# Patient Record
Sex: Male | Born: 1935 | Race: White | Hispanic: No | Smoking: Former smoker
Health system: Southern US, Community
[De-identification: ages and names within clinical notes are randomized; demographics above are authoritative.]

## PROBLEM LIST (undated history)

## (undated) DIAGNOSIS — I10 Essential (primary) hypertension: Secondary | ICD-10-CM

## (undated) DIAGNOSIS — E039 Hypothyroidism, unspecified: Secondary | ICD-10-CM

## (undated) DIAGNOSIS — Z7901 Long term (current) use of anticoagulants: Secondary | ICD-10-CM

## (undated) DIAGNOSIS — I4819 Other persistent atrial fibrillation: Secondary | ICD-10-CM

## (undated) DIAGNOSIS — N184 Chronic kidney disease, stage 4 (severe): Secondary | ICD-10-CM

## (undated) DIAGNOSIS — I25810 Atherosclerosis of coronary artery bypass graft(s) without angina pectoris: Secondary | ICD-10-CM

## (undated) DIAGNOSIS — E785 Hyperlipidemia, unspecified: Secondary | ICD-10-CM

## (undated) HISTORY — DX: Long term (current) use of anticoagulants: Z79.01

## (undated) HISTORY — DX: Hyperlipidemia, unspecified: E78.5

## (undated) HISTORY — DX: Atherosclerosis of coronary artery bypass graft(s) without angina pectoris: I25.810

## (undated) HISTORY — DX: Essential (primary) hypertension: I10

## (undated) HISTORY — PX: JOINT REPLACEMENT: SHX530

## (undated) HISTORY — DX: Other persistent atrial fibrillation: I48.19

## (undated) HISTORY — PX: TOTAL KNEE ARTHROPLASTY: SHX125

---

## 1999-08-12 HISTORY — PX: CORONARY ARTERY BYPASS GRAFT: SHX141

## 1999-08-12 HISTORY — PX: CARDIAC CATHETERIZATION: SHX172

## 2000-05-07 ENCOUNTER — Encounter: Payer: Self-pay | Admitting: Interventional Cardiology

## 2000-05-07 ENCOUNTER — Ambulatory Visit (HOSPITAL_COMMUNITY): Admission: RE | Admit: 2000-05-07 | Discharge: 2000-05-07 | Payer: Self-pay | Admitting: Interventional Cardiology

## 2000-05-11 ENCOUNTER — Encounter: Payer: Self-pay | Admitting: Surgery

## 2000-05-11 ENCOUNTER — Inpatient Hospital Stay (HOSPITAL_COMMUNITY): Admission: RE | Admit: 2000-05-11 | Discharge: 2000-05-17 | Payer: Self-pay | Admitting: Surgery

## 2000-05-12 ENCOUNTER — Encounter: Payer: Self-pay | Admitting: Surgery

## 2000-05-17 ENCOUNTER — Encounter: Payer: Self-pay | Admitting: Surgery

## 2006-10-02 ENCOUNTER — Ambulatory Visit: Payer: Self-pay | Admitting: *Deleted

## 2008-09-11 DEATH — deceased

## 2011-05-20 ENCOUNTER — Other Ambulatory Visit: Payer: Self-pay | Admitting: Orthopedic Surgery

## 2011-05-20 ENCOUNTER — Encounter (HOSPITAL_COMMUNITY): Payer: Medicare Other

## 2011-05-20 LAB — COMPREHENSIVE METABOLIC PANEL
ALT: 22 U/L (ref 0–53)
AST: 22 U/L (ref 0–37)
Albumin: 3.6 g/dL (ref 3.5–5.2)
Alkaline Phosphatase: 82 U/L (ref 39–117)
BUN: 23 mg/dL (ref 6–23)
CO2: 24 mEq/L (ref 19–32)
Calcium: 9 mg/dL (ref 8.4–10.5)
Chloride: 106 mEq/L (ref 96–112)
Creatinine, Ser: 1.45 mg/dL — ABNORMAL HIGH (ref 0.50–1.35)
GFR calc Af Amer: 52 mL/min — ABNORMAL LOW (ref >90)
GFR calc non Af Amer: 45 mL/min — ABNORMAL LOW (ref >90)
Glucose, Bld: 127 mg/dL — ABNORMAL HIGH (ref 70–99)
Potassium: 4.5 mEq/L (ref 3.5–5.1)
Sodium: 140 mEq/L (ref 135–145)
Total Bilirubin: 0.3 mg/dL (ref 0.3–1.2)
Total Protein: 7.7 g/dL (ref 6.0–8.3)

## 2011-05-20 LAB — URINALYSIS, ROUTINE W REFLEX MICROSCOPIC
Bilirubin Urine: NEGATIVE
Glucose, UA: NEGATIVE mg/dL
Hgb urine dipstick: NEGATIVE
Ketones, ur: NEGATIVE mg/dL
Nitrite: NEGATIVE
Protein, ur: NEGATIVE mg/dL
Specific Gravity, Urine: 1.027 (ref 1.005–1.030)
Urobilinogen, UA: 0.2 mg/dL (ref 0.0–1.0)
pH: 5.5 (ref 5.0–8.0)

## 2011-05-20 LAB — CBC
HCT: 43.6 % (ref 39.0–52.0)
Hemoglobin: 14.1 g/dL (ref 13.0–17.0)
MCH: 30.2 pg (ref 26.0–34.0)
MCHC: 32.3 g/dL (ref 30.0–36.0)
MCV: 93.4 fL (ref 78.0–100.0)
Platelets: 143 10*3/uL — ABNORMAL LOW (ref 150–400)
RBC: 4.67 MIL/uL (ref 4.22–5.81)
RDW: 14.3 % (ref 11.5–15.5)
WBC: 9.8 10*3/uL (ref 4.0–10.5)

## 2011-05-20 LAB — PROTIME-INR
INR: 1.46 (ref 0.00–1.49)
Prothrombin Time: 18 seconds — ABNORMAL HIGH (ref 11.6–15.2)

## 2011-05-20 LAB — URINE MICROSCOPIC-ADD ON

## 2011-05-20 LAB — SURGICAL PCR SCREEN
MRSA, PCR: NEGATIVE
Staphylococcus aureus: NEGATIVE

## 2011-05-20 LAB — APTT: aPTT: 35 seconds (ref 24–37)

## 2011-05-23 ENCOUNTER — Inpatient Hospital Stay (HOSPITAL_COMMUNITY)
Admission: RE | Admit: 2011-05-23 | Discharge: 2011-05-27 | DRG: 470 | Disposition: A | Discharge status: Home Health | Payer: Medicare Other | Source: Ambulatory Visit | Attending: Orthopedic Surgery | Admitting: Orthopedic Surgery

## 2011-05-23 DIAGNOSIS — M1710 Unilateral primary osteoarthritis, unspecified knee: Principal | ICD-10-CM | POA: Diagnosis present

## 2011-05-23 DIAGNOSIS — I4891 Unspecified atrial fibrillation: Secondary | ICD-10-CM | POA: Diagnosis present

## 2011-05-23 DIAGNOSIS — Z01812 Encounter for preprocedural laboratory examination: Secondary | ICD-10-CM

## 2011-05-23 DIAGNOSIS — M21169 Varus deformity, not elsewhere classified, unspecified knee: Secondary | ICD-10-CM | POA: Diagnosis present

## 2011-05-23 DIAGNOSIS — I10 Essential (primary) hypertension: Secondary | ICD-10-CM | POA: Diagnosis present

## 2011-05-23 LAB — TYPE AND SCREEN: ABO/RH(D): A POS

## 2011-05-23 LAB — ABO/RH: ABO/RH(D): A POS

## 2011-05-23 NOTE — Op Note (Signed)
NAME:  SHELTON, KRELL NO.:  [id number]  MEDICAL RECORD NO.:  [id number]  LOCATION:  0003                         FACILITY:  [facility]  PHYSICIAN:  Gaynelle Arabian, M.D.    DATE OF BIRTH:  08/12/1931  DATE OF PROCEDURE:  05/23/2011 DATE OF DISCHARGE:                              OPERATIVE REPORT   PREOPERATIVE DIAGNOSIS:  Osteoarthritis, right knee.  POSTOPERATIVE DIAGNOSIS:  Osteoarthritis, right knee.  PROCEDURE:  Right total knee arthroplasty.  SURGEON:  Gaynelle Arabian, M.D.  ASSISTANT:  Alexzandrew L. Perkins, P.A.C.  ANESTHESIA:  Spinal.  ESTIMATED BLOOD LOSS:  Minimal.  DRAINS:  Hemovac x1.  TOURNIQUET TIME:  43 minutes at 300 mmHg.  FINDINGS:  Bone-on-bone arthritis in the medial compartment both femur and tibia as well as patellofemoral compartment with varus deformity.  COMPLICATIONS:  None.  CONDITION:  Stable to recovery.  CLINICAL NOTE:  Mr. Kurt Atkinson is a 77 year old male with advanced end- stage arthritis of the right knee with progressively worsening pain and dysfunction.  He has failed nonoperative management.  He has significant dysfunction and knee gives out, makes difficult for him to walk.  He has bone-on-bone arthritis in the medial and patellofemoral compartments radiographically.  He presents now for total knee arthroplasty.  PROCEDURE IN DETAIL:  After successful administration of spinal anesthetic, a tourniquet was placed high on his right thigh and his right lower extremity was prepped and draped in the usual sterile fashion.  Extremities wrapped in Esmarch, knee flexed, tourniquet inflated to 300 mmHg.  Midline incision was made with a 10-blade through subcutaneous tissue to the level of the extensor mechanism.  A fresh blade was used to make a medial parapatellar arthrotomy.  Soft tissue on the proximal medial tibia subperiosteally elevated to the joint line with the knife and into the semimembranosus bursa with a Cobb  elevator. Soft tissue laterally was elevated with attention being paid to avoid the patellar tendon on tibial tubercle.  Patella was everted, knee flexed to 90 degrees, and ACL and PCL removed.  Drill was used to create a starting hole in the distal femur and the canal was thoroughly irrigated.  The 5-degree right valgus alignment guide was placed and the distal femoral cutting block was pinned to remove 11-mm off the distal femur.  Distal femoral resection was made with an oscillating saw.  The tibia was subluxed forward and the menisci removed.  There was a large defect on the medial tibial plateau.  The extramedullary tibial cutting guide was placed, referencing proximally at the medial aspect of the tibial tubercle and distally along the second metatarsal axis and tibial crest.  The block was pinned to remove 2-mm off the more deficient medial side.  Tibial resection was made with an oscillating saw.  The thickness of the cuts were couple of millimeters more than usual due to the defect.  Size 4 was the most appropriate tibial component and proximal tibia was prepared to modular drill and keel punch for the size 4.  The femoral sizing guide was placed size 5 was most appropriate. Rotation was marked off the epicondylar axis and confirmed by creating rectangular flexion gap at 90 degrees.  The block was  pinned in that rotation and the anterior, posterior, and chamfer cuts were made. Intercondylar block was placed and that cuts made.  Trial size 4 posterior stabilized femur was placed.  A 12.5-mm posterior stabilized rotating platform insert trial was placed.  There was a little play with that, so went to 15, which still allowed for full extension with excellent varus-valgus and anterior-posterior balance throughout full range of motion.  The patella was everted and thickness measured to be 25 mm.  Freehand resection was taken to 14 mm, 41 template was placed, lug holes were drilled,  trial patella was placed and it tracks normally. The osteophytes were removed off the posterior femur with the trial in place.  All trials were removed and the cut bone surfaces are prepared with pulsatile lavage.  The cement was mixed and once ready for implantation, the size 4 mobile bearing tibial tray size 4 posterior stabilized femur, and the 41 patella were cemented into place.  The patella was held with a clamp.  Trial 15-mm inserts placed, knee held in full extension, all extruded cement were removed.  Once the cement was fully hardened, then a permanent 15-mm posterior stabilized rotating platform insert was placed into the tibial tray.  Wound was copiously irrigated with saline solution and the arthrotomy was closed over Hemovac drain with interrupted #1 PDS.  Flexion against gravity was 135 degrees, the patella tracks normally.  The tourniquet was released with total time of 43 minutes.  Subcu was closed with interrupted 2-0 Vicryl and subcuticular running 4-0 Monocryl.  Incisions cleaned and dried and Steri-Strips and a bulky sterile dressing were applied.  The patient was then placed into a knee immobilizer, awakened, and transported to recovery in stable condition.  Please note that it was a medical necessity for a surgical assistant on this procedure in order to perform it in a safe and expeditious manner. The surgical assistant was necessary for retraction of ligaments and vital neurovascular structures.  The surgical assistant was also necessary for proper placement of the limb to allow for anatomic alignment of the prosthesis.     Gaynelle Arabian, M.D.     FA/MEDQ  D:  05/23/2011  T:  05/23/2011  Job:  [id number]  Electronically Signed by Gaynelle Arabian M.D. on 05/23/2011 04:28:52 PM

## 2011-05-24 LAB — BASIC METABOLIC PANEL
BUN: 22 mg/dL (ref 6–23)
CO2: 25 mEq/L (ref 19–32)
Calcium: 7.9 mg/dL — ABNORMAL LOW (ref 8.4–10.5)
Creatinine, Ser: 1.22 mg/dL (ref 0.50–1.35)
GFR calc Af Amer: 65 mL/min — ABNORMAL LOW (ref >90)

## 2011-05-24 LAB — CBC
MCH: 30.8 pg (ref 26.0–34.0)
MCHC: 33.7 g/dL (ref 30.0–36.0)
RDW: 13.8 % (ref 11.5–15.5)

## 2011-05-24 LAB — PROTIME-INR
INR: 1.2 (ref 0.00–1.49)
Prothrombin Time: 15.5 seconds — ABNORMAL HIGH (ref 11.6–15.2)

## 2011-05-25 ENCOUNTER — Inpatient Hospital Stay (HOSPITAL_COMMUNITY): Payer: Medicare Other

## 2011-05-25 LAB — CBC
HCT: 29 % — ABNORMAL LOW (ref 39.0–52.0)
HCT: 29.1 % — ABNORMAL LOW (ref 39.0–52.0)
MCH: 30.6 pg (ref 26.0–34.0)
MCHC: 33.1 g/dL (ref 30.0–36.0)
MCHC: 33.3 g/dL (ref 30.0–36.0)
MCV: 92.4 fL (ref 78.0–100.0)
MCV: 91.8 fL (ref 78.0–100.0)
Platelets: 105 10*3/uL — ABNORMAL LOW (ref 150–400)
Platelets: 103 10*3/uL — ABNORMAL LOW (ref 150–400)
RDW: 14.1 % (ref 11.5–15.5)
RDW: 14.2 % (ref 11.5–15.5)
WBC: 21.6 10*3/uL — ABNORMAL HIGH (ref 4.0–10.5)

## 2011-05-25 LAB — URINE MICROSCOPIC-ADD ON

## 2011-05-25 LAB — BASIC METABOLIC PANEL
CO2: 24 mEq/L (ref 19–32)
Calcium: 8 mg/dL — ABNORMAL LOW (ref 8.4–10.5)
Creatinine, Ser: 1.18 mg/dL (ref 0.50–1.35)
GFR calc non Af Amer: 58 mL/min — ABNORMAL LOW (ref >90)
Glucose, Bld: 130 mg/dL — ABNORMAL HIGH (ref 70–99)
Sodium: 132 mEq/L — ABNORMAL LOW (ref 135–145)

## 2011-05-25 LAB — URINALYSIS, ROUTINE W REFLEX MICROSCOPIC
Glucose, UA: NEGATIVE mg/dL
Leukocytes, UA: NEGATIVE
Specific Gravity, Urine: 1.025 (ref 1.005–1.030)

## 2011-05-25 LAB — DIFFERENTIAL
Basophils Absolute: 0 10*3/uL (ref 0.0–0.1)
Basophils Relative: 0 % (ref 0–1)
Eosinophils Absolute: 0 10*3/uL (ref 0.0–0.7)
Lymphocytes Relative: 10 % — ABNORMAL LOW (ref 12–46)
Lymphs Abs: 2.1 10*3/uL (ref 0.7–4.0)
Monocytes Absolute: 2.5 10*3/uL — ABNORMAL HIGH (ref 0.1–1.0)
Neutro Abs: 16.4 10*3/uL — ABNORMAL HIGH (ref 1.7–7.7)

## 2011-05-26 ENCOUNTER — Inpatient Hospital Stay (HOSPITAL_COMMUNITY): Payer: Medicare Other

## 2011-05-26 LAB — PROTIME-INR
INR: 1.63 — ABNORMAL HIGH (ref 0.00–1.49)
Prothrombin Time: 19.6 seconds — ABNORMAL HIGH (ref 11.6–15.2)

## 2011-05-26 LAB — CBC
Hemoglobin: 8.9 g/dL — ABNORMAL LOW (ref 13.0–17.0)
MCHC: 33.3 g/dL (ref 30.0–36.0)
Platelets: 96 10*3/uL — ABNORMAL LOW (ref 150–400)
RBC: 2.94 MIL/uL — ABNORMAL LOW (ref 4.22–5.81)

## 2011-05-27 LAB — CBC
HCT: 24.5 % — ABNORMAL LOW (ref 39.0–52.0)
Platelets: 113 10*3/uL — ABNORMAL LOW (ref 150–400)
RDW: 14 % (ref 11.5–15.5)
WBC: 15.5 10*3/uL — ABNORMAL HIGH (ref 4.0–10.5)

## 2011-05-27 LAB — DIFFERENTIAL
Basophils Absolute: 0 10*3/uL (ref 0.0–0.1)
Basophils Relative: 0 % (ref 0–1)
Eosinophils Relative: 0 % (ref 0–5)
Lymphocytes Relative: 10 % — ABNORMAL LOW (ref 12–46)

## 2011-05-27 LAB — PROTIME-INR
INR: 1.57 — ABNORMAL HIGH (ref 0.00–1.49)
Prothrombin Time: 19.1 seconds — ABNORMAL HIGH (ref 11.6–15.2)

## 2011-05-27 LAB — BASIC METABOLIC PANEL
CO2: 24 mEq/L (ref 19–32)
Calcium: 8.3 mg/dL — ABNORMAL LOW (ref 8.4–10.5)
GFR calc Af Amer: 62 mL/min — ABNORMAL LOW (ref >90)
GFR calc non Af Amer: 53 mL/min — ABNORMAL LOW (ref >90)
Sodium: 129 mEq/L — ABNORMAL LOW (ref 135–145)

## 2011-06-15 NOTE — Discharge Summary (Signed)
NAME:  Kurt Atkinson, Kurt Atkinson NO.:  [id number]  MEDICAL RECORD NO.:  [id number]  LOCATION:  [id number]                         FACILITY:  [facility]  PHYSICIAN:  Gaynelle Arabian, M.D.    DATE OF BIRTH:  Aug 28, 1931  DATE OF ADMISSION:  05/23/2011 DATE OF DISCHARGE:  05/27/2011                              DISCHARGE SUMMARY   ADMITTING DIAGNOSES: 1. Osteoarthritis, right knee. 2. Hand tremor. 3. Hypertension. 4. Coronary arterial disease. 5. Hypercholesterolemia. 6. Heart arrhythmia. 7. Paroxysmal atrial fibrillation. 8. Hypothyroidism.  DISCHARGE DIAGNOSES: 1. Osteoarthritis, right knee status post right total knee replacement     arthroplasty. 2. Postoperative acute blood loss anemia. 3. Hyponatremia. 4. Hypertension. 5. Coronary arterial disease. 6. Hypercholesterolemia. 7. Heart arrhythmia. 8. Paroxysmal atrial fibrillation. 9. Hypothyroidism. 10.Postoperative atelectasis.  PROCEDURE:  May 23, 2011, right total knee.  SURGEON:  Gaynelle Arabian, MD  ASSISTANT:  Alexzandrew L. Perkins, P.A.C.  ANESTHESIA:  Spinal.  TOURNIQUET TIME:  43 minutes.  CONSULTATION:  None.  BRIEF HISTORY:  The patient is a 77 year old male with advanced arthritis of the right knee, progressive worsening pain and dysfunction, failed operative management, now presents for a total knee arthroplasty.  LABORATORY DATA:  CBC on admission not scanned in the chart, but serial CBCs were followed.  Hemoglobin was 9.8, drifted down to 8.9.  Last noted hemoglobin and hematocrit 8.2 and 24.5, did not require blood. PT/INR on admission not scanned in the chart, but serial protimes followed per Coumadin protocol.  Last noted PT/INR 19.1 and 1.57.  Chem panel on admission not scanned in the chart.  Serial BMETs were followed.  Sodium did drop from 135 to 132, last noted at 129.  Glucose went up from 121 to 130, back down to 118.  BUN went up a little bit from 22 to 24.  Creatinine remained  stable.  Preop UA, moderate hemoglobin, positive protein, 0 to 2 white cells, otherwise negative. Blood group type A positive.  X-rays:  Chest x-ray on May 25, 2011, cardiomegaly without overt edema or focal airspace consolidation, tiny bilateral pleural effusion.  Chest x-ray on May 26, 2011, suspicion of mild congestive heart failure, fluid overload, pulmonary venous hypertension with small, but enlarging views of mild bibasilar atelectasis.  HOSPITAL COURSE:  The patient admitted to [facility], taken to OR, underwent above-stated procedure without complication.  The patient tolerated procedure well, later transferred to the recovery room from orthopedic floor, started on p.o. and IV analgesics, pain control following surgery, given 24 hours postoperative IV antibiotics.  The Hemovac drain was pulled without difficulty on day 1.  He also had his Foley removed by request, he started PT and OT, weightbearing as tolerated, getting up and out of bed.  By day 2, he was doing a little bit better, still in a fair amount of pain though.  He did have some elevated temperatures and had checked a followup chest x-ray, which was as above.  He started back on his home meds.  Dressing was changed on day 2.  Incision looked good.  Hemoglobin was down a little bit, down to 9.6.  He also had a little bit of confusion, felt due to pain medications,  so those meds were changed.  He continued to slowly progressing by day 3, he was seen in rounds by Dr. Wynelle Link.  The chest x- ray and UA were okay.  He did not have a bowel movement, so we worked on his bowels with new medications.  White count was a little elevated, but his hemoglobin was down a little bit at 8.9.  INR was at 1.6.  Incision looked good.  Did not show any signs of infection, but he was not ready for home, so we kept him another day until the following day of October, 2016, he was doing better.  White count had improved back  down to 15. His hemoglobin was 8.2, but he is asymptomatic with this.  Had a little bit of low sodium, felt to be a delusional component.  We are going to allow him to construct back up on his own.  He did well with his therapy.  He did not develop any symptoms and he was discharged home later that day.  DISCHARGE PLAN: 1. The patient discharged home on May 27, 2011. 2. Discharge diagnoses:  Please see above. 3. Discharge meds:  Tramadol, Robaxin, Coumadin, Lovenox, Nu-Iron.     Continue home meds of amiodarone, Crestor, Equate muscle rub, Icy     Hot packs, Norvasc, primidone, Synthroid.  DIET:  Heart-healthy diet.  ACTIVITY:  Weightbearing as tolerated.  Total knee protocol.  FOLLOWUP:  2 weeks.  DISPOSITION:  Home.  CONDITION UPON DISCHARGE:  Improved.     Alexzandrew L. Dara Lords, P.A.C.   ______________________________ Gaynelle Arabian, M.D.    ALP/MEDQ  D:  06/12/2011  T:  06/12/2011  Job:  [id number]  cc:   Levada Dy, MD  Pernell Dupre, MD  Electronically Signed by Mickel Crow P.A.C. on 06/14/2011 09:26:53 AM Electronically Signed by Gaynelle Arabian M.D. on 06/15/2011 08:39:43 PM

## 2011-06-15 NOTE — H&P (Addendum)
NAME:  GARRED, ROQUES NO.:  [id number]  MEDICAL RECORD NO.:  [id number]  LOCATION:  PADM                         FACILITY:  [facility]  PHYSICIAN:  Gaynelle Arabian, M.D.    DATE OF BIRTH:  [birth year]  DATE OF ADMISSION:  05/20/2011 DATE OF DISCHARGE:  05/20/2011                             HISTORY & PHYSICAL   DATE OF ADMISSION:  May 23, 2011  CHIEF COMPLAINT:  Right knee pain.  HISTORY OF PRESENT ILLNESS:  The patient is a 77 year old male who has been seen by Dr. Wynelle Link for ongoing right knee problems for quite some time now.  He has had progressive dysfunction and pain.  He has been seen in the office, found to have advancing arthritis with already bone- on-bone in medial and also patellofemoral compartments.  He is at a stage now where his knee is impacting his lifestyle and his mobility. It is felt he would be a good candidate.  Risks and benefits have been discussed.  He would like to proceed with surgery.  He denies any contraindication such as ongoing infection or progressive neurological disease.  ALLERGIES:  PENICILLIN causes rash.  CURRENT MEDICATIONS:  Amiodarone, Mysoline, Coumadin, Synthroid, Crestor, Norvasc.  PAST MEDICAL HISTORY:  Hand tremor, hypertension, heart disease, hypercholesterolemia, heart arrhythmia with paroxysmal atrial fibrillation, hypothyroidism.  PAST SURGICAL HISTORY:  Coronary artery bypass grafting of 4 vessels in 2001 by Dr. Cyndia Bent.  FAMILY HISTORY:  Father deceased, age 77, with Hodgkin's.  Mother deceased, age 54, with hypertension and heart problems.  SOCIAL HISTORY:  Married, retired, quit smoking about 20 years ago.  No alcohol.  He does want to go to his son's house following the procedure.  REVIEW OF SYSTEMS:  GENERAL:  No fever, chills, night sweats. NEUROLOGIC:  Hand tremor.  No seizures, syncope, or paralysis. RESPIRATORY:  No shortness of breath, productive cough, or hemoptysis. CARDIOVASCULAR:  No  chest pain.  No orthopnea.  GI:  No nausea, vomiting, diarrhea, constipation.  GU:  Little bit of nocturia.  No dysuria, hematuria.  MUSCULOSKELETAL:  Knee pain.  PHYSICAL EXAMINATION:  VITAL SIGNS:  Pulse 56, respirations 12, blood pressure 142/68. GENERAL:  A 77 year old white male, well nourished, well developed.  No acute distress.  He is alert, oriented, cooperative, good historian, accompanied by his wife. HEENT:  Normocephalic, atraumatic.  Pupils are round, reactive.  EOMs intact.  Never wore glasses.  Does have partial upper and lower denture plates. NECK:  Supple.  No bruits. CHEST:  Clear. HEART:  Bradycardic rhythm 56, otherwise regular.  No murmurs.  S1 and S2 noted. ABDOMEN:  Soft, nontender.  Bowel sounds present. RECTAL, BREASTS, GENITALIA:  Not done, not part of present illness. EXTREMITIES:  Right knee, significant varus deformity.  Range of motion 10 to 115, marked crepitus.  Tender more medial than lateral.  IMPRESSION:  Osteoarthritis, right knee.  PLAN:  The patient admitted to [facility] to undergo a right total knee replacement arthroplasty.  Surgery will be performed by Dr. Gaynelle Arabian.  He wants to go to his family's home following hospital stay.     Alexzandrew L. Dara Lords, P.A.C.   ______________________________ Gaynelle Arabian, M.D.    ALP/MEDQ  D:  06/12/2011  T:  06/12/2011  Job:  [id number]  cc:   Dr. Pernell Dupre  [facility]  Electronically Signed by Mickel Crow P.A.C. on 06/14/2011 09:27:06 AM Electronically Signed by Gaynelle Arabian M.D. on 06/15/2011 08:39:47 PM

## 2014-01-05 ENCOUNTER — Encounter: Payer: Self-pay | Admitting: Interventional Cardiology

## 2014-01-05 ENCOUNTER — Ambulatory Visit (INDEPENDENT_AMBULATORY_CARE_PROVIDER_SITE_OTHER): Payer: Medicare Other | Admitting: Interventional Cardiology

## 2014-01-05 VITALS — BP 124/62 | HR 52 | Ht 74.0 in | Wt 185.0 lb

## 2014-01-05 DIAGNOSIS — I10 Essential (primary) hypertension: Secondary | ICD-10-CM

## 2014-01-05 DIAGNOSIS — Z79899 Other long term (current) drug therapy: Secondary | ICD-10-CM

## 2014-01-05 DIAGNOSIS — I4819 Other persistent atrial fibrillation: Secondary | ICD-10-CM

## 2014-01-05 DIAGNOSIS — Z7901 Long term (current) use of anticoagulants: Secondary | ICD-10-CM

## 2014-01-05 DIAGNOSIS — I25810 Atherosclerosis of coronary artery bypass graft(s) without angina pectoris: Secondary | ICD-10-CM

## 2014-01-05 DIAGNOSIS — Z9229 Personal history of other drug therapy: Secondary | ICD-10-CM

## 2014-01-05 DIAGNOSIS — E785 Hyperlipidemia, unspecified: Secondary | ICD-10-CM

## 2014-01-05 DIAGNOSIS — I4891 Unspecified atrial fibrillation: Secondary | ICD-10-CM

## 2014-01-05 DIAGNOSIS — Z5181 Encounter for therapeutic drug level monitoring: Secondary | ICD-10-CM

## 2014-01-05 DIAGNOSIS — I25709 Atherosclerosis of coronary artery bypass graft(s), unspecified, with unspecified angina pectoris: Secondary | ICD-10-CM | POA: Insufficient documentation

## 2014-01-05 HISTORY — DX: Long term (current) use of anticoagulants: Z79.01

## 2014-01-05 HISTORY — DX: Essential (primary) hypertension: I10

## 2014-01-05 HISTORY — DX: Hyperlipidemia, unspecified: E78.5

## 2014-01-05 HISTORY — DX: Other persistent atrial fibrillation: I48.19

## 2014-01-05 HISTORY — DX: Atherosclerosis of coronary artery bypass graft(s) without angina pectoris: I25.810

## 2014-01-05 NOTE — Progress Notes (Signed)
Patient ID: Kurt Atkinson, male   DOB: Jan 20, 1932, 80 y.o.   MRN: [id number]    [address] [place], [place]  [address] Phone: [phone] Fax:  [phone]  Date:  01/05/2014   ID:  Kurt Atkinson, DOB 1932-08-03, MRN [id number]  PCP:  Sinclair Grooms, MD   ASSESSMENT:  1. Paroxysmal atrial fibrillation controlled on amiodarone 2. Hypertension, controlled 3. Coronary artery disease, stable 4. Amiodarone therapy without evidence of toxicity 5. chronic anticoagulation therapy  PLAN:  1. TSH, hepatic panel, and chest x-ray in 6 months 2. A followup in one year   SUBJECTIVE: Kurt Atkinson is a 80 y.o. male who is asymptomatic. He has coronary disease and atrial fibrillation on amiodarone for suppression of recurrences. He has had no clinical recurrences. Overall he feels that he is doing. He has not noted blood in urine or stool.  Wt Readings from Last 3 Encounters:  01/05/14 185 lb (83.915 kg)     No past medical history on file.  Current Outpatient Prescriptions  Medication Sig Dispense Refill  . amiodarone (PACERONE) 200 MG tablet Take 1 tablet by mouth daily.      Marland Kitchen amLODipine (NORVASC) 5 MG tablet Take 1 tablet by mouth daily.      Marland Kitchen levothyroxine (SYNTHROID, LEVOTHROID) 200 MCG tablet Take 100 mcg by mouth daily before breakfast.      . primidone (MYSOLINE) 50 MG tablet Take 1 tablet by mouth 2 (two) times daily.      . simvastatin (ZOCOR) 40 MG tablet Take 20 mg by mouth daily.      Marland Kitchen warfarin (COUMADIN) 2.5 MG tablet as directed.       No current facility-administered medications for this visit.    Allergies:   Allergies not on file  Social History:  The patient  reports that he has quit smoking. He does not have any smokeless tobacco history on file. He reports that he does not drink alcohol or use illicit drugs.   ROS:  Please see the history of present illness.   No blood in the urine or stool   All other systems reviewed and negative.    OBJECTIVE: VS:  BP 124/62  Pulse 52  Ht 6\' 2"  (1.88 m)  Wt 185 lb (83.915 kg)  BMI 23.74 kg/m2 Well nourished, well developed, in no acute distress, healthy HEENT: normal Neck: JVD flat. Carotid bruit absent  Cardiac:  normal S1, S2; RRR; no murmur Lungs:  clear to auscultation bilaterally, no wheezing, rhonchi or rales Abd: soft, nontender, no hepatomegaly Ext: Edema absent. Pulses 2+ and symmetric Skin: warm and dry Neuro:  CNs 2-12 intact, no focal abnormalities noted  EKG:  Sinus bradycardia at 52 beats per minute     TSH is 6.22; creatinine 1.65; hepatic panel is unremarkable.  PA and lateral chest x-ray shows no acute process Signed, Illene Labrador III, MD 01/05/2014 3:34 PM

## 2014-01-05 NOTE — Patient Instructions (Signed)
Your physician recommends that you continue on your current medications as directed. Please refer to the Current Medication list given to you today.  In 6 months have the following test done with your primary care physician and results faxed to our office [phone] Attn Dr.Smith:(Labs TSH, Hepatic, and a Chest Xray)  Your physician wants you to follow-up in: 1 year with Dr.Smith You will receive a reminder letter in the mail two months in advance. If you don't receive a letter, please call our office to schedule the follow-up appointment.

## 2014-06-30 ENCOUNTER — Encounter: Payer: Self-pay | Admitting: Interventional Cardiology

## 2014-09-07 ENCOUNTER — Encounter: Payer: Self-pay | Admitting: Interventional Cardiology

## 2015-01-11 ENCOUNTER — Encounter: Payer: Self-pay | Admitting: Interventional Cardiology

## 2015-01-11 ENCOUNTER — Ambulatory Visit (INDEPENDENT_AMBULATORY_CARE_PROVIDER_SITE_OTHER): Payer: Medicare Other | Admitting: Interventional Cardiology

## 2015-01-11 VITALS — BP 112/52 | HR 66 | Ht 74.0 in | Wt 184.0 lb

## 2015-01-11 DIAGNOSIS — Z5181 Encounter for therapeutic drug level monitoring: Secondary | ICD-10-CM

## 2015-01-11 DIAGNOSIS — I10 Essential (primary) hypertension: Secondary | ICD-10-CM

## 2015-01-11 DIAGNOSIS — I25810 Atherosclerosis of coronary artery bypass graft(s) without angina pectoris: Secondary | ICD-10-CM

## 2015-01-11 DIAGNOSIS — Z7901 Long term (current) use of anticoagulants: Secondary | ICD-10-CM | POA: Diagnosis not present

## 2015-01-11 DIAGNOSIS — I480 Paroxysmal atrial fibrillation: Secondary | ICD-10-CM

## 2015-01-11 DIAGNOSIS — Z79899 Other long term (current) drug therapy: Secondary | ICD-10-CM

## 2015-01-11 MED ORDER — AMIODARONE HCL 100 MG PO TABS: 100.0000 mg | ORAL_TABLET | Freq: Every day | ORAL | Status: DC

## 2015-01-11 NOTE — Progress Notes (Signed)
Cardiology Office Note   Date:  01/11/2015   ID:  Gurbir Mender, DOB 1932-04-02, MRN [id number]  PCP:  Sinclair Grooms, MD  Cardiologist:  Sinclair Grooms, MD   Chief Complaint  Patient presents with  . CAD OF ARTERY BYPASS GRAFT      History of Present Illness: Kurt Atkinson is a 81 y.o. male who presents for coronary artery disease with prior bypass grafting 2001, essential hypertension, atrial fibrillation, chronic anticoagulation, and amiodarone therapy.  He is doing well without cardiopulmonary complaints. Has not had his thyroid function done but will be performed at the [place]. He denies angina. He has not had syncope or significant palpitations. He denies peripheral edema and orthopnea.   No past medical history on file.  Past Surgical History  Procedure Laterality Date  . Coronary artery bypass graft       Current Outpatient Prescriptions  Medication Sig Dispense Refill  . amiodarone (PACERONE) 100 MG tablet Take 1 tablet (100 mg total) by mouth daily.    Marland Kitchen amLODipine (NORVASC) 5 MG tablet Take 1 tablet by mouth daily.    Marland Kitchen levothyroxine (SYNTHROID, LEVOTHROID) 200 MCG tablet Take 100 mcg by mouth daily before breakfast.    . primidone (MYSOLINE) 50 MG tablet Take 50 mg by mouth daily.    . simvastatin (ZOCOR) 40 MG tablet Take 20 mg by mouth daily.    Marland Kitchen warfarin (COUMADIN) 2.5 MG tablet as directed.     No current facility-administered medications for this visit.    Allergies:   Penicillins and Shrimp    Social History:  The patient  reports that he has quit smoking. He does not have any smokeless tobacco history on file. He reports that he does not drink alcohol or use illicit drugs.   Family History:  The patient's family history includes Appendicitis in his sister; Arthritis in his father and mother; Cancer in his sister; Diabetes in his brother; Healthy in his sister; Heart disease in his brother, brother, father, mother, sister, and sister.     ROS:  Please see the history of present illness.   Otherwise, review of systems are positive for fatigue.   All other systems are reviewed and negative.    PHYSICAL EXAM: VS:  BP 112/52 mmHg  Pulse 66  Ht 6\' 2"  (1.88 m)  Wt 184 lb (83.462 kg)  BMI 23.61 kg/m2  SpO2 95% , BMI Body mass index is 23.61 kg/(m^2). GEN: Well nourished, well developed, in no acute distress HEENT: normal Neck: no JVD, carotid bruits, or masses Cardiac: RRR; 3/6 holosystolic murmur MR. No rubs, or gallops,no edema.  Respiratory:  clear to auscultation bilaterally, normal work of breathing GI: soft, nontender, nondistended, + BS MS: no deformity or atrophy Skin: warm and dry, no rash Neuro:  Strength and sensation are intact Psych: euthymic mood, full affect   EKG:  EKG is not ordered today.    Recent Labs: No results found for requested labs within last 365 days.    Lipid Panel No results found for: CHOL, TRIG, HDL, CHOLHDL, VLDL, LDLCALC, LDLDIRECT    Wt Readings from Last 3 Encounters:  01/11/15 184 lb (83.462 kg)  01/05/14 185 lb (83.915 kg)      Other studies Reviewed: Additional studies/ records that were reviewed today include data from [facility] primary care physician Barrington Ellison, M.D. Review of the above records demonstrates: Hepatic panel is normal. BUN and creatinine are 28 and 1.5 to. Hemoglobin is 13.8. Chest x-ray  from 12/28/2014 demonstrates no acute cardiopulmonary abnormality. The only data that we do not have his the thyroid function study.   ASSESSMENT AND PLAN:  Paroxysmal atrial fibrillation:   Coronary artery disease involving coronary bypass graft of native heart without angina pectoris  Chronic anticoagulation  Encounter for monitoring amiodarone therapy  Essential hypertension     Current medicines are reviewed at length with the patient today.  The patient does not have concerns regarding medicines.  The following changes have been made:  He is  concerned about being on amiodarone for so long. We will plan to decrease amiodarone to 100 mg daily. Six-month follow-up with me.  Labs/ tests ordered today include:  No orders of the defined types were placed in this encounter.     Disposition:   FU with HS in 6 months  Signed, Sinclair Grooms, MD  01/11/2015 2:29 PM    [address], [place], [place]  [address] Phone: [phone]; Fax: [phone]

## 2015-01-11 NOTE — Patient Instructions (Addendum)
Medication Instructions:  Your physician has recommended you make the following change in your medication:  REDUCE Amiodarone to 100mg  daily   Labwork: None   Testing/Procedures: None   Follow-Up: Your physician wants you to follow-up in: 6 months with an EKG.You will receive a reminder letter in the mail two months in advance. If you don't receive a letter, please call our office to schedule the follow-up appointment.   Any Other Special Instructions Will Be Listed Below (If Applicable). Call the office if your are having palpitations,racing heart,fatigue

## 2015-01-26 ENCOUNTER — Telehealth: Payer: Self-pay

## 2015-01-26 NOTE — Telephone Encounter (Signed)
01/26/15 X-Ray Disc from [facility] received from [place] through courier, put on shelf.[place]itt [place]

## 2015-04-26 ENCOUNTER — Telehealth: Payer: Self-pay | Admitting: Interventional Cardiology

## 2015-04-26 NOTE — Telephone Encounter (Signed)
[id number] 10:45AM pt states he was trinning some bushes and   He was out in the heat  He got into A-fIB   he is not having any issues currently but he wants to   speak to a rn to see if he should come in

## 2015-04-26 NOTE — Telephone Encounter (Signed)
Returned pt call. Pt sts that he was outdoor yesterday doing yard work, he became over heated and went in to [employer]. Pt sts that he went inside rested and hydrated. Pt sts that he converted back to NSR in 1-2 hours. Pt is currently taking Amiodarone 100mg  qd. Pt sts that he is asymptomatic today and feels great. Adv pt that he  Should f/u as planned with Dr.Smith in Dec. He should call the office if he is having increased episodes of afib, and if the are lasting linger in duration. Adv pt I will fwd an update to Dr.Smith and call back if he has additional recommendations. Pt agreeable with plan and verbalized understanding.

## 2015-04-29 NOTE — Telephone Encounter (Signed)
Increase amiodarone to 200 mg daily. We will continue this until I see him in December.

## 2015-04-30 MED ORDER — AMIODARONE HCL 200 MG PO TABS: 200.0000 mg | ORAL_TABLET | Freq: Every day | ORAL | Status: DC

## 2015-04-30 NOTE — Telephone Encounter (Signed)
Pt aware of Dr.Smith's recommendation. Increase amiodarone to 200 mg daily. We will continue this until I see him in December.. Pt agreeable and verbalized understanding. Pt sts that he does not need a new Rx at this time. Pt adv to contact the office if symptoms develop or he has any break through afib.

## 2015-07-02 ENCOUNTER — Encounter: Payer: Self-pay | Admitting: Interventional Cardiology

## 2015-10-15 ENCOUNTER — Encounter: Payer: Self-pay | Admitting: Interventional Cardiology

## 2015-10-15 ENCOUNTER — Ambulatory Visit (INDEPENDENT_AMBULATORY_CARE_PROVIDER_SITE_OTHER): Payer: Medicare Other | Admitting: Interventional Cardiology

## 2015-10-15 VITALS — BP 142/70 | HR 62 | Ht 74.0 in | Wt 181.0 lb

## 2015-10-15 DIAGNOSIS — Z5181 Encounter for therapeutic drug level monitoring: Secondary | ICD-10-CM

## 2015-10-15 DIAGNOSIS — Z79899 Other long term (current) drug therapy: Secondary | ICD-10-CM

## 2015-10-15 DIAGNOSIS — Z7901 Long term (current) use of anticoagulants: Secondary | ICD-10-CM | POA: Diagnosis not present

## 2015-10-15 DIAGNOSIS — I25810 Atherosclerosis of coronary artery bypass graft(s) without angina pectoris: Secondary | ICD-10-CM

## 2015-10-15 DIAGNOSIS — Z9229 Personal history of other drug therapy: Secondary | ICD-10-CM

## 2015-10-15 DIAGNOSIS — I10 Essential (primary) hypertension: Secondary | ICD-10-CM

## 2015-10-15 DIAGNOSIS — I480 Paroxysmal atrial fibrillation: Secondary | ICD-10-CM | POA: Diagnosis not present

## 2015-10-15 DIAGNOSIS — Z09 Encounter for follow-up examination after completed treatment for conditions other than malignant neoplasm: Secondary | ICD-10-CM | POA: Diagnosis not present

## 2015-10-15 NOTE — Progress Notes (Signed)
Cardiology Office Note   Date:  10/15/2015   ID:  Kurt Atkinson, DOB 27-Nov-1931, MRN [id number]  PCP:  Sinclair Grooms, MD  Cardiologist:  Sinclair Grooms, MD   Chief Complaint  Patient presents with  . Atrial Fibrillation      History of Present Illness: Kurt Atkinson is a 82 y.o. male who presents for CAD with prior bypass surgery, hypertension, chronic diastolic heart failure, paroxysmal atrial fibrillation, and chronic anticoagulation therapy on Coumadin.  Doing well. No angina. Denies dyspnea and syncope. We attempted to decrease amiodarone to 100 mg per day. He feels that approximately one month after we did this, he redeveloped atrial fibrillation and resume 200 mg per day. He feels that within hours his heart went back and rhythm. No recurrence since that time. Has continue the current dose of amiodarone. No transient neurological complaints. He is tired of taking Coumadin and wonders if he will get the same benefit from an aspirin per day. Activities are not limited  Past Medical History  Diagnosis Date  . Hyperlipidemia 01/05/2014  . Essential hypertension 01/05/2014  . Chronic anticoagulation 01/05/2014    Coumadin   . CAD (coronary artery disease) of artery bypass graft 01/05/2014    CABG,, 2001   . Atrial fibrillation ([place]) 01/05/2014    Rhythm control with amiodarone     Past Surgical History  Procedure Laterality Date  . Coronary artery bypass graft       Current Outpatient Prescriptions  Medication Sig Dispense Refill  . amiodarone (PACERONE) 200 MG tablet Take 1 tablet (200 mg total) by mouth daily.    Marland Kitchen amLODipine (NORVASC) 5 MG tablet Take 1 tablet by mouth daily.    Marland Kitchen levothyroxine (SYNTHROID, LEVOTHROID) 200 MCG tablet Take 100 mcg by mouth daily before breakfast.    . primidone (MYSOLINE) 50 MG tablet Take 50 mg by mouth daily.    . simvastatin (ZOCOR) 40 MG tablet Take 20 mg by mouth daily.    Marland Kitchen warfarin (COUMADIN) 3 MG tablet Take 3 mg by  mouth as directed.     No current facility-administered medications for this visit.    Allergies:   Penicillins and Shrimp    Social History:  The patient  reports that he has quit smoking. He does not have any smokeless tobacco history on file. He reports that he does not drink alcohol or use illicit drugs.   Family History:  The patient's family history includes Appendicitis in his sister; Arthritis in his father and mother; Cancer in his sister; Diabetes in his brother; Healthy in his sister; Heart disease in his brother, brother, father, mother, sister, and sister.    ROS:  Please see the history of present illness.   Otherwise, review of systems are positive for Difficulty with hearing, joint stiffness, frequent urination at night..   All other systems are reviewed and negative.    PHYSICAL EXAM: VS:  BP 142/70 mmHg  Pulse 62  Ht 6\' 2"  (1.88 m)  Wt 181 lb (82.101 kg)  BMI 23.23 kg/m2 , BMI Body mass index is 23.23 kg/(m^2). GEN: Well nourished, well developed, in no acute distress HEENT: normal Neck: no JVD, carotid bruits, or masses Cardiac: RRR.  There is no murmur, rub, or gallop. There is no edema. Respiratory:  clear to auscultation bilaterally, normal work of breathing. GI: soft, nontender, nondistended, + BS MS: no deformity or atrophy Skin: warm and dry, no rash Neuro:  Strength and sensation are intact Psych:  euthymic mood, full affect   EKG:  EKG is ordered today. The ekg reveals normal sinus rhythm, QTC 458, nonspecific ST abnormality.   Recent Labs: No results found for requested labs within last 365 days.    Lipid Panel No results found for: CHOL, TRIG, HDL, CHOLHDL, VLDL, LDLCALC, LDLDIRECT    Wt Readings from Last 3 Encounters:  10/15/15 181 lb (82.101 kg)  01/11/15 184 lb (83.462 kg)  01/05/14 185 lb (83.915 kg)      Other studies Reviewed: Additional studies/ records that were reviewed today include: Reviewed laboratory data from primary  physician done in November. The findings include hepatic panel and TSH were normal.. Labs are located and an entry under the labs From 06/20/2015.    ASSESSMENT AND PLAN:  1. Paroxysmal atrial fibrillation (HCC) Rhythm control with amiodarone 200 mg per day  2. H/O amiodarone therapy We tried 100 mg per day but had recurrent atrial fibrillation.  3. Chronic anticoagulation Coumadin therapy without complications.  4. Encounter for monitoring amiodarone therapy No evidence of toxicity.  5. Essential hypertension Well controlled.  6. Coronary artery disease involving coronary bypass graft of native heart without angina pectoris Asymptomatic.    Current medicines are reviewed at length with the patient today.  The patient has the following concerns regarding medicines: None other than increasing Ambu bag 200 mg per day..  The following changes/actions have been instituted:    P and lateral chest x-ray on the OFFICE visit  TSH, hepatic panel, to be done this summer primary care physician or the [facility]. They will for results.  Labs/ tests ordered today include:   Orders Placed This Encounter  Procedures  . EKG 12-Lead     Disposition:   FU with HS in 1 year  Signed, Sinclair Grooms, MD  10/15/2015 12:50 PM    [facility] [address], [place], [place]  [address] Phone: [phone]; Fax: [phone]

## 2015-10-15 NOTE — Patient Instructions (Signed)

## 2016-05-16 ENCOUNTER — Encounter: Payer: Self-pay | Admitting: Interventional Cardiology

## 2016-05-19 ENCOUNTER — Telehealth: Payer: Self-pay | Admitting: Interventional Cardiology

## 2016-05-19 NOTE — Telephone Encounter (Signed)
Pt c/o BP issue:  1. What are your last 5 BP readings? 10-8 123/97, last night 153/107, this am 137/84 2. Are you having any other symptoms (ex. Dizziness, headache, blurred vision, passed out)? No-n symptomatic 3. What is your medication issue? Not sure if need to be increased  [phone] pt's cell

## 2016-05-19 NOTE — Telephone Encounter (Signed)
Pt states Saturday he wasn't feeling well.    On Sunday pt took BP after church and it was noted to be 123/97 so he took an extra Amiodarone 100mg .  Pt checked BP again at 9:30pm and it was noted to be 153/107, HR 85 so then he took his Amlodipine.  Pt states he seen his PCP on Friday and had a good check up.  Received flu shot at this appt.  Pt states he feels a little better today.  Vitals at 7:30 this morning were 137/84, HR 83.  Took meds at 8:15.  Had pt check vitals while I was on the phone with him and they were 121/74, HR 96.  Pt denies HA, CP, SOB, fever, nausea or sweating.  Advised pt I will send message to Dr. Tamala Julian to review.  Pt appreciative for all assistance.

## 2016-05-19 NOTE — Telephone Encounter (Signed)
Spoke with pt and informed him of orders and recommendations per Dr.Smith.  Pt will call PCP in the morning and ask if they can do an EKG.  Asked pt to call our office and let us know either way.  Provided pt with fax number so EKG could be sent to our office.  Pt verbalized understanding.

## 2016-05-19 NOTE — Telephone Encounter (Signed)
Increase amiodarone to 200 mg twice a day until Wednesday. Give [place] a call on Wednesday to let us know if he feels better. Try to have an EKG performed on Tuesday by his primary care physician.

## 2016-05-20 ENCOUNTER — Encounter: Payer: Self-pay | Admitting: Interventional Cardiology

## 2016-05-20 NOTE — Telephone Encounter (Signed)
F/u Message  Pt call requesting to speak with RN. pt states the EKG has been posted. Please call back to discuss id needed

## 2016-05-20 NOTE — Telephone Encounter (Signed)
Spoke with pt and he states he was able to get EKG done at Dr. Kizzie Ide office this morning.  Pt states they said they would send that right over.  Advised pt I will keep an eye out for it and bring it to Dr.Smith's attn.  Pt appreciative.

## 2016-05-20 NOTE — Telephone Encounter (Signed)
Agree with note below.  

## 2016-05-20 NOTE — Telephone Encounter (Signed)
EKG obtained and reviewed by Dr. Tamala Julian.  Pt noted to be in Afib.  Dr. Tamala Julian would like for pt to take Amiodarone 200mg  BID x 1 week and have INR checked Thurs or Fri at Dr. Cordelia Poche office and fax it to our office.  Spoke with pt and advised him of recommendations per Dr. Tamala Julian.  Pt verbalized understanding and was in agreement with this plan.

## 2016-05-22 ENCOUNTER — Encounter: Payer: Self-pay | Admitting: Interventional Cardiology

## 2016-05-22 LAB — PROTIME-INR: INR: 2.5 — AB (ref 0.9–1.1)

## 2016-05-26 NOTE — Telephone Encounter (Signed)
I spoke with the pt and he is still in AFib.  The pt said he has been taking the Amiodarone 200mg  twice a day for the past week as recommended by Dr Tamala Julian.  The pt checked his BP and pulse while on the phone with me, 124/77 and 112. At this time the pt feels fine. I will discuss this pt with Dr Tamala Julian.

## 2016-05-26 NOTE — Telephone Encounter (Signed)
Mr. Kurt Atkinson is calling because he is still in Afib , been in it all week. His blood pressure has been   104/176 and pulse is 114 .  Please call

## 2016-05-26 NOTE — Telephone Encounter (Signed)
I discussed this pt with Dr Tamala Julian and he would like to arrange DCCV this week for the pt depending upon INR results.  I contacted Dr Cordelia Poche office at [phone] and spoke with Truman Hayward and requested the pt's last 5 INR results. I have tentatively scheduled the pt for DCCV on 05/30/16 at 1:00, arrive 11:00 AM. I made the pt aware that procedure scheduled for later this week and we will also arrange for EKG to be performed prior to the pt going to the hospital. I will follow up with the pt tomorrow once INR results have been received. Pt agreed with plan.

## 2016-05-27 NOTE — Telephone Encounter (Signed)
INR results received. 05/22/16 2.5 05/16/16 2.1 04/03/16 2.4 02/21/16 2.0 01/10/16 2.7  I will have the pt come into the office on 05/30/16 at 10:30 for an EKG to be performed prior to going to the hospital for Cardioversion. The pt is aware of pre-cardioversion instructions.

## 2016-05-28 ENCOUNTER — Other Ambulatory Visit: Payer: Self-pay | Admitting: Interventional Cardiology

## 2016-05-28 DIAGNOSIS — I480 Paroxysmal atrial fibrillation: Secondary | ICD-10-CM

## 2016-05-28 MED ORDER — SODIUM CHLORIDE 0.9% FLUSH: 3.0000 mL | Freq: Two times a day (BID) | INTRAVENOUS | Status: DC

## 2016-05-28 MED ORDER — SODIUM CHLORIDE 0.9% FLUSH: 3.0000 mL | INTRAVENOUS | Status: DC | PRN

## 2016-05-28 MED ORDER — SODIUM CHLORIDE 0.9 % IV SOLN: 250.0000 mL | INTRAVENOUS | Status: DC

## 2016-05-29 NOTE — Telephone Encounter (Signed)
Thanks

## 2016-05-30 ENCOUNTER — Ambulatory Visit (INDEPENDENT_AMBULATORY_CARE_PROVIDER_SITE_OTHER): Payer: Medicare Other | Admitting: *Deleted

## 2016-05-30 ENCOUNTER — Ambulatory Visit (HOSPITAL_COMMUNITY): Payer: Medicare Other | Admitting: Anesthesiology

## 2016-05-30 ENCOUNTER — Encounter (HOSPITAL_COMMUNITY)
Admission: RE | Disposition: A | Discharge status: Home / Self Care | Payer: Self-pay | Source: Ambulatory Visit | Attending: Cardiology

## 2016-05-30 ENCOUNTER — Encounter (HOSPITAL_COMMUNITY): Payer: Self-pay | Admitting: *Deleted

## 2016-05-30 ENCOUNTER — Ambulatory Visit (HOSPITAL_COMMUNITY)
Admission: RE | Admit: 2016-05-30 | Discharge: 2016-05-30 | Disposition: A | Discharge status: Home / Self Care | Payer: Medicare Other | Source: Ambulatory Visit | Attending: Cardiology | Admitting: Cardiology

## 2016-05-30 ENCOUNTER — Encounter: Payer: Self-pay | Admitting: *Deleted

## 2016-05-30 VITALS — HR 114 | Wt 181.0 lb

## 2016-05-30 DIAGNOSIS — I480 Paroxysmal atrial fibrillation: Secondary | ICD-10-CM

## 2016-05-30 DIAGNOSIS — Z951 Presence of aortocoronary bypass graft: Secondary | ICD-10-CM | POA: Insufficient documentation

## 2016-05-30 DIAGNOSIS — I5032 Chronic diastolic (congestive) heart failure: Secondary | ICD-10-CM | POA: Insufficient documentation

## 2016-05-30 DIAGNOSIS — Z87891 Personal history of nicotine dependence: Secondary | ICD-10-CM | POA: Insufficient documentation

## 2016-05-30 DIAGNOSIS — Z880 Allergy status to penicillin: Secondary | ICD-10-CM | POA: Insufficient documentation

## 2016-05-30 DIAGNOSIS — Z91013 Allergy to seafood: Secondary | ICD-10-CM | POA: Diagnosis not present

## 2016-05-30 DIAGNOSIS — Z5309 Procedure and treatment not carried out because of other contraindication: Secondary | ICD-10-CM | POA: Insufficient documentation

## 2016-05-30 DIAGNOSIS — I110 Hypertensive heart disease with heart failure: Secondary | ICD-10-CM | POA: Insufficient documentation

## 2016-05-30 DIAGNOSIS — Z8261 Family history of arthritis: Secondary | ICD-10-CM | POA: Insufficient documentation

## 2016-05-30 DIAGNOSIS — I25810 Atherosclerosis of coronary artery bypass graft(s) without angina pectoris: Secondary | ICD-10-CM | POA: Insufficient documentation

## 2016-05-30 DIAGNOSIS — Z79899 Other long term (current) drug therapy: Secondary | ICD-10-CM | POA: Diagnosis not present

## 2016-05-30 DIAGNOSIS — Z7901 Long term (current) use of anticoagulants: Secondary | ICD-10-CM | POA: Diagnosis not present

## 2016-05-30 DIAGNOSIS — E785 Hyperlipidemia, unspecified: Secondary | ICD-10-CM | POA: Diagnosis not present

## 2016-05-30 DIAGNOSIS — Z8249 Family history of ischemic heart disease and other diseases of the circulatory system: Secondary | ICD-10-CM | POA: Diagnosis not present

## 2016-05-30 DIAGNOSIS — I4892 Unspecified atrial flutter: Secondary | ICD-10-CM | POA: Diagnosis present

## 2016-05-30 LAB — POCT I-STAT 4, (NA,K, GLUC, HGB,HCT)
Glucose, Bld: 101 mg/dL — ABNORMAL HIGH (ref 65–99)
HCT: 41 % (ref 39.0–52.0)
Hemoglobin: 13.9 g/dL (ref 13.0–17.0)
Potassium: 4.6 mmol/L (ref 3.5–5.1)
Sodium: 143 mmol/L (ref 135–145)

## 2016-05-30 LAB — PROTIME-INR
INR: 1.58
Prothrombin Time: 19.1 seconds — ABNORMAL HIGH (ref 11.4–15.2)

## 2016-05-30 SURGERY — CANCELLED PROCEDURE
Anesthesia: Monitor Anesthesia Care

## 2016-05-30 MED ORDER — SODIUM CHLORIDE 0.9 % IV SOLN
INTRAVENOUS | Status: DC
  Administered 2016-05-30: 12:00:00 via INTRAVENOUS

## 2016-05-30 NOTE — Anesthesia Preprocedure Evaluation (Deleted)
Anesthesia Evaluation  Patient identified by MRN, date of birth, ID band Patient awake    Reviewed: Allergy & Precautions, H&P , NPO status , Patient's Chart, lab work & pertinent test results  History of Anesthesia Complications Negative for: history of anesthetic complications  Airway Mallampati: II  TM Distance: >3 FB Neck ROM: full    Dental no notable dental hx.    Pulmonary former smoker,    Pulmonary exam normal breath sounds clear to auscultation       Cardiovascular hypertension, + CAD and + CABG  Normal cardiovascular exam+ dysrhythmias Atrial Fibrillation  Rhythm:regular Rate:Normal     Neuro/Psych negative neurological ROS     GI/Hepatic negative GI ROS, Neg liver ROS,   Endo/Other  negative endocrine ROS  Renal/GU negative Renal ROS     Musculoskeletal   Abdominal   Peds  Hematology negative hematology ROS (+)   Anesthesia Other Findings   Reproductive/Obstetrics negative OB ROS                             Anesthesia Physical Anesthesia Plan  ASA: III  Anesthesia Plan: MAC   Post-op Pain Management:    Induction: Intravenous  Airway Management Planned: Mask  Additional Equipment:   Intra-op Plan:   Post-operative Plan:   Informed Consent: I have reviewed the patients History and Physical, chart, labs and discussed the procedure including the risks, benefits and alternatives for the proposed anesthesia with the patient or authorized representative who has indicated his/her understanding and acceptance.   Dental Advisory Given  Plan Discussed with: Anesthesiologist, CRNA and Surgeon  Anesthesia Plan Comments:         Anesthesia Quick Evaluation

## 2016-05-30 NOTE — CV Procedure (Signed)
    Cardioversion Note  Kurt Atkinson [id number] 03-Jul-1932  Procedure: DC Cardioversion Indications: atrial flutter  Procedure Details Consent: Obtained  Today's INR 1.58. The case was cancelled. The patient is on Coumadin 3 mg po daily. We will send a new prescription for 4 mg po daily to be taken tonight, Saturday and Sunday night. He will have his INR checked by his PCP on Monday. They will call us if < 2. We will reschedule for a TEE/DCCV for Tuesday 06/03/2016. The case was explained and discussed with the patient, his wife and his daughter who works at [facility] [facility]. They agree.    [name], MD, [facility] 05/30/2016, 2:12 PM

## 2016-05-30 NOTE — H&P (Signed)
Patient ID: Kurt Atkinson MRN: [id number], DOB/AGE: Nov 07, 1931   Admit date: 05/30/2016   Primary Physician: Sinclair Grooms, MD Primary Cardiologist: Dr Ysidro Evert List  Past Medical History:  Diagnosis Date  . Atrial fibrillation ([place]) 01/05/2014   Rhythm control with amiodarone   . CAD (coronary artery disease) of artery bypass graft 01/05/2014   CABG,, 2001   . Chronic anticoagulation 01/05/2014   Coumadin   . Essential hypertension 01/05/2014  . Hyperlipidemia 01/05/2014    Past Surgical History:  Procedure Laterality Date  . CORONARY ARTERY BYPASS GRAFT       Allergies  Allergies  Allergen Reactions  . Penicillins Itching and Rash  . Shrimp [Shellfish Allergy] Diarrhea and Nausea And Vomiting    HPI  Patient is a 82 y.o. male with a PMHx of CAD with prior bypass surgery, hypertension, chronic diastolic heart failure, paroxysmal atrial fibrillation, and chronic anticoagulation therapy on Coumadin, who was admitted to [facility] on 05/30/2016 for an elective DCCV for a paroxysmal atrial flutter.  He has been complaint with his meds and took warfarin the last night. His INR was therapeutic in the last 2 months.   Home Medications  Prior to Admission medications   Medication Sig Start Date End Date Taking? Authorizing Provider  amiodarone (PACERONE) 200 MG tablet Take 1 tablet (200 mg total) by mouth daily. 04/30/15  Yes Belva Crome, MD  amLODipine (NORVASC) 5 MG tablet Take 1 tablet by mouth daily. 12/07/13  Yes Historical Provider, MD  levothyroxine (SYNTHROID, LEVOTHROID) 200 MCG tablet Take 100 mcg by mouth daily before breakfast.   Yes Historical Provider, MD  primidone (MYSOLINE) 50 MG tablet Take 50 mg by mouth daily.   Yes Historical Provider, MD  simvastatin (ZOCOR) 40 MG tablet Take 20 mg by mouth daily.   Yes Historical Provider, MD  warfarin (COUMADIN) 3 MG tablet Take 3 mg by mouth as directed. 10/12/15  Yes Historical Provider, MD    Family  History  Family History  Problem Relation Age of Onset  . Arthritis Mother   . Heart disease Mother   . Heart disease Father   . Arthritis Father   . Healthy Sister   . Heart disease Brother   . Diabetes Brother   . Heart disease Brother   . Heart disease Sister   . Appendicitis Sister   . Cancer Sister   . Heart disease Sister     Social History  Social History   Social History  . Marital status: Married    Spouse name: [name]  . Number of children: [name]  . Years of education: [name]   Occupational History  . Not on file.   Social History Main Topics  . Smoking status: Former Research scientist (life sciences)  . Smokeless tobacco: Not on file  . Alcohol use No  . Drug use: No  . Sexual activity: Not on file   Other Topics Concern  . Not on file   Social History Narrative  . No narrative on file     Review of Systems General:  No chills, fever, night sweats or weight changes.  Cardiovascular:  No chest pain, dyspnea on exertion, edema, orthopnea, palpitations, paroxysmal nocturnal dyspnea. Dermatological: No rash, lesions/masses Respiratory: No cough, dyspnea Urologic: No hematuria, dysuria Abdominal:   No nausea, vomiting, diarrhea, bright red blood per rectum, melena, or hematemesis Neurologic:  No visual changes, wkns, changes in mental status. All other systems reviewed and are otherwise negative except as noted  above.  Physical Exam  Blood pressure (!) 148/86, pulse (!) 111, temperature 97.8 F (36.6 C), temperature source Oral, resp. rate 12, height 6\' 2"  (1.88 m), weight 181 lb (82.1 kg), SpO2 96 %.  General: Pleasant, NAD Psych: Normal affect. Neuro: Alert and oriented X 3. Moves all extremities spontaneously. HEENT: Normal  Neck: Supple without bruits or JVD. Lungs:  Resp regular and unlabored, CTA. Heart: RRR no s3, s4, or murmurs. Abdomen: Soft, non-tender, non-distended, BS + x 4.  Extremities: No clubbing, cyanosis or edema. DP/PT/Radials 2+ and equal  bilaterally.  Labs  No results for input(s): CKTOTAL, CKMB, TROPONINI in the last 72 hours. Lab Results  Component Value Date   WBC 15.5 (H) 05/27/2011   HGB 13.9 05/30/2016   HCT 41.0 05/30/2016   MCV 90.1 05/27/2011   PLT 113 (L) 05/27/2011    Recent Labs Lab 05/30/16 1155  NA 143  K 4.6  GLUCOSE 101*   No results found for: CHOL, HDL, LDLCALC, TRIG No results found for: DDIMER Invalid input(s): POCBNP   Radiology/Studies    ASSESSMENT AND PLAN  82 year old male with paroxysmal atrial flutter presenting for DCCV.   DVT PPX - Warfarin   Signed, [name], MD, [facility] 05/30/2016, 12:58 PM

## 2016-05-30 NOTE — Progress Notes (Signed)
1.) Reason for visit: EKG  2.) Name of MD requesting visit: Smith  3.) ROS related to problem: Pt in for EKG prior to DCCV to check rhythm.  4.) Assessment and plan per MD: DOD, Dr. Johnsie Cancel, reviewed EKG and pt noted to be in [place].  Dr Johnsie Cancel agreed with continuing with plan for DCCV.

## 2016-06-03 ENCOUNTER — Ambulatory Visit (HOSPITAL_COMMUNITY): Payer: Medicare Other | Admitting: Anesthesiology

## 2016-06-03 ENCOUNTER — Encounter (HOSPITAL_COMMUNITY)
Admission: RE | Disposition: A | Discharge status: Home / Self Care | Payer: Self-pay | Source: Ambulatory Visit | Attending: Cardiology

## 2016-06-03 ENCOUNTER — Other Ambulatory Visit: Payer: Self-pay

## 2016-06-03 ENCOUNTER — Ambulatory Visit (HOSPITAL_COMMUNITY)
Admission: RE | Admit: 2016-06-03 | Discharge: 2016-06-03 | Disposition: A | Discharge status: Home / Self Care | Payer: Medicare Other | Source: Ambulatory Visit | Attending: Cardiology | Admitting: Cardiology

## 2016-06-03 ENCOUNTER — Encounter (HOSPITAL_COMMUNITY): Payer: Self-pay | Admitting: *Deleted

## 2016-06-03 ENCOUNTER — Ambulatory Visit (HOSPITAL_BASED_OUTPATIENT_CLINIC_OR_DEPARTMENT_OTHER): Payer: Medicare Other

## 2016-06-03 DIAGNOSIS — I481 Persistent atrial fibrillation: Secondary | ICD-10-CM

## 2016-06-03 DIAGNOSIS — I25810 Atherosclerosis of coronary artery bypass graft(s) without angina pectoris: Secondary | ICD-10-CM | POA: Diagnosis not present

## 2016-06-03 DIAGNOSIS — Z79899 Other long term (current) drug therapy: Secondary | ICD-10-CM | POA: Insufficient documentation

## 2016-06-03 DIAGNOSIS — I5032 Chronic diastolic (congestive) heart failure: Secondary | ICD-10-CM | POA: Insufficient documentation

## 2016-06-03 DIAGNOSIS — Z833 Family history of diabetes mellitus: Secondary | ICD-10-CM | POA: Insufficient documentation

## 2016-06-03 DIAGNOSIS — I340 Nonrheumatic mitral (valve) insufficiency: Secondary | ICD-10-CM | POA: Diagnosis not present

## 2016-06-03 DIAGNOSIS — I2510 Atherosclerotic heart disease of native coronary artery without angina pectoris: Secondary | ICD-10-CM | POA: Insufficient documentation

## 2016-06-03 DIAGNOSIS — Z8261 Family history of arthritis: Secondary | ICD-10-CM | POA: Insufficient documentation

## 2016-06-03 DIAGNOSIS — I110 Hypertensive heart disease with heart failure: Secondary | ICD-10-CM | POA: Diagnosis not present

## 2016-06-03 DIAGNOSIS — Z7901 Long term (current) use of anticoagulants: Secondary | ICD-10-CM | POA: Diagnosis not present

## 2016-06-03 DIAGNOSIS — Z951 Presence of aortocoronary bypass graft: Secondary | ICD-10-CM | POA: Diagnosis not present

## 2016-06-03 DIAGNOSIS — Z8249 Family history of ischemic heart disease and other diseases of the circulatory system: Secondary | ICD-10-CM | POA: Diagnosis not present

## 2016-06-03 DIAGNOSIS — Z880 Allergy status to penicillin: Secondary | ICD-10-CM | POA: Insufficient documentation

## 2016-06-03 DIAGNOSIS — I480 Paroxysmal atrial fibrillation: Secondary | ICD-10-CM | POA: Insufficient documentation

## 2016-06-03 DIAGNOSIS — Z91013 Allergy to seafood: Secondary | ICD-10-CM | POA: Insufficient documentation

## 2016-06-03 DIAGNOSIS — Z87891 Personal history of nicotine dependence: Secondary | ICD-10-CM | POA: Insufficient documentation

## 2016-06-03 DIAGNOSIS — E785 Hyperlipidemia, unspecified: Secondary | ICD-10-CM | POA: Diagnosis not present

## 2016-06-03 DIAGNOSIS — I4891 Unspecified atrial fibrillation: Secondary | ICD-10-CM

## 2016-06-03 DIAGNOSIS — I4892 Unspecified atrial flutter: Secondary | ICD-10-CM | POA: Insufficient documentation

## 2016-06-03 HISTORY — PX: CARDIOVERSION: SHX1299

## 2016-06-03 HISTORY — PX: TEE WITHOUT CARDIOVERSION: SHX5443

## 2016-06-03 SURGERY — ECHOCARDIOGRAM, TRANSESOPHAGEAL
Anesthesia: Monitor Anesthesia Care

## 2016-06-03 MED ORDER — PROPOFOL 500 MG/50ML IV EMUL
INTRAVENOUS | Status: DC | PRN
  Administered 2016-06-03: 75 ug/kg/min via INTRAVENOUS

## 2016-06-03 MED ORDER — LACTATED RINGERS IV SOLN
INTRAVENOUS | Status: DC | PRN
  Administered 2016-06-03: 10:00:00 via INTRAVENOUS

## 2016-06-03 MED ORDER — PROPOFOL 10 MG/ML IV BOLUS
INTRAVENOUS | Status: DC | PRN
  Administered 2016-06-03: 20 mg via INTRAVENOUS

## 2016-06-03 MED ORDER — BUTAMBEN-TETRACAINE-BENZOCAINE 2-2-14 % EX AERO
INHALATION_SPRAY | CUTANEOUS | Status: DC | PRN
  Administered 2016-06-03: 2 via TOPICAL

## 2016-06-03 NOTE — Anesthesia Postprocedure Evaluation (Signed)
Anesthesia Post Note  Patient: Kurt Atkinson  Procedure(s) Performed: Procedure(s) (LRB): TRANSESOPHAGEAL ECHOCARDIOGRAM (TEE) ([name]) CARDIOVERSION ([name])  Patient location during evaluation: PACU Anesthesia Type: MAC Level of consciousness: awake and alert Pain management: pain level controlled Vital Signs Assessment: post-procedure vital signs reviewed and stable Respiratory status: spontaneous breathing, nonlabored ventilation and respiratory function stable Cardiovascular status: stable and blood pressure returned to baseline Anesthetic complications: no    Last Vitals:  Vitals:   06/03/16 1100 06/03/16 1110  BP: (!) 121/50 (!) 143/56  Pulse: (!) 51 (!) 57  Resp: 16 12  Temp:      Last Pain:  Vitals:   06/03/16 1052  TempSrc: Oral                 Nevan Creighton,W. EDMOND

## 2016-06-03 NOTE — Interval H&P Note (Signed)
History and Physical Interval Note:  06/03/2016 10:28 AM  Kurt Atkinson  has presented today for surgery, with the diagnosis of a fib  The various methods of treatment have been discussed with the patient and family. After consideration of risks, benefits and other options for treatment, the patient has consented to  Procedure(s): TRANSESOPHAGEAL ECHOCARDIOGRAM (TEE) ([name]) CARDIOVERSION ([name]) as a surgical intervention .  The patient's history has been reviewed, patient examined, no change in status, stable for surgery.  I have reviewed the patient's chart and labs.  Questions were answered to the patient's satisfaction.     [employer]

## 2016-06-03 NOTE — H&P (View-Only) (Signed)
Patient ID: Kurt Atkinson MRN: [id number], DOB/AGE: [birth year]   Admit date: 05/30/2016   Primary Physician: Sinclair Grooms, MD Primary Cardiologist: Dr Ysidro Evert List  Past Medical History:  Diagnosis Date  . Atrial fibrillation ([place]) 01/05/2014   Rhythm control with amiodarone   . CAD (coronary artery disease) of artery bypass graft 01/05/2014   CABG,, 2001   . Chronic anticoagulation 01/05/2014   Coumadin   . Essential hypertension 01/05/2014  . Hyperlipidemia 01/05/2014    Past Surgical History:  Procedure Laterality Date  . CORONARY ARTERY BYPASS GRAFT       Allergies  Allergies  Allergen Reactions  . Penicillins Itching and Rash  . Shrimp [Shellfish Allergy] Diarrhea and Nausea And Vomiting    HPI  Patient is a 82 y.o. male with a PMHx of CAD with prior bypass surgery, hypertension, chronic diastolic heart failure, paroxysmal atrial fibrillation, and chronic anticoagulation therapy on Coumadin, who was admitted to [facility] on 05/30/2016 for an elective DCCV for a paroxysmal atrial flutter.  He has been complaint with his meds and took warfarin the last night. His INR was therapeutic in the last 2 months.   Home Medications  Prior to Admission medications   Medication Sig Start Date End Date Taking? Authorizing Provider  amiodarone (PACERONE) 200 MG tablet Take 1 tablet (200 mg total) by mouth daily. 04/30/15  Yes Belva Crome, MD  amLODipine (NORVASC) 5 MG tablet Take 1 tablet by mouth daily. 12/07/13  Yes Historical Provider, MD  levothyroxine (SYNTHROID, LEVOTHROID) 200 MCG tablet Take 100 mcg by mouth daily before breakfast.   Yes Historical Provider, MD  primidone (MYSOLINE) 50 MG tablet Take 50 mg by mouth daily.   Yes Historical Provider, MD  simvastatin (ZOCOR) 40 MG tablet Take 20 mg by mouth daily.   Yes Historical Provider, MD  warfarin (COUMADIN) 3 MG tablet Take 3 mg by mouth as directed. 10/12/15  Yes Historical Provider, MD    Family  History  Family History  Problem Relation Age of Onset  . Arthritis Mother   . Heart disease Mother   . Heart disease Father   . Arthritis Father   . Healthy Sister   . Heart disease Brother   . Diabetes Brother   . Heart disease Brother   . Heart disease Sister   . Appendicitis Sister   . Cancer Sister   . Heart disease Sister     Social History  Social History   Social History  . Marital status: Married    Spouse name: [name]  . Number of children: [name]  . Years of education: [name]   Occupational History  . Not on file.   Social History Main Topics  . Smoking status: Former Research scientist (life sciences)  . Smokeless tobacco: Not on file  . Alcohol use No  . Drug use: No  . Sexual activity: Not on file   Other Topics Concern  . Not on file   Social History Narrative  . No narrative on file     Review of Systems General:  No chills, fever, night sweats or weight changes.  Cardiovascular:  No chest pain, dyspnea on exertion, edema, orthopnea, palpitations, paroxysmal nocturnal dyspnea. Dermatological: No rash, lesions/masses Respiratory: No cough, dyspnea Urologic: No hematuria, dysuria Abdominal:   No nausea, vomiting, diarrhea, bright red blood per rectum, melena, or hematemesis Neurologic:  No visual changes, wkns, changes in mental status. All other systems reviewed and are otherwise negative except as noted  above.  Physical Exam  Blood pressure (!) 148/86, pulse (!) 111, temperature 97.8 F (36.6 C), temperature source Oral, resp. rate 12, height 6\' 2"  (1.88 m), weight 181 lb (82.1 kg), SpO2 96 %.  General: Pleasant, NAD Psych: Normal affect. Neuro: Alert and oriented X 3. Moves all extremities spontaneously. HEENT: Normal  Neck: Supple without bruits or JVD. Lungs:  Resp regular and unlabored, CTA. Heart: RRR no s3, s4, or murmurs. Abdomen: Soft, non-tender, non-distended, BS + x 4.  Extremities: No clubbing, cyanosis or edema. DP/PT/Radials 2+ and equal  bilaterally.  Labs  No results for input(s): CKTOTAL, CKMB, TROPONINI in the last 72 hours. Lab Results  Component Value Date   WBC 15.5 (H) 05/27/2011   HGB 13.9 05/30/2016   HCT 41.0 05/30/2016   MCV 90.1 05/27/2011   PLT 113 (L) 05/27/2011    Recent Labs Lab 05/30/16 1155  NA 143  K 4.6  GLUCOSE 101*   No results found for: CHOL, HDL, LDLCALC, TRIG No results found for: DDIMER Invalid input(s): POCBNP   Radiology/Studies    ASSESSMENT AND PLAN  82 year old male with paroxysmal atrial flutter presenting for DCCV.   DVT PPX - Warfarin   Signed, [name], MD, [facility] 05/30/2016, 12:58 PM

## 2016-06-03 NOTE — CV Procedure (Signed)
      Electrical Cardioversion Procedure Note Kurt Atkinson [id number] 1932/04/15  Procedure: Electrical Cardioversion Indications:  Atrial Fibrillation  Time Out: Verified patient identification, verified procedure,medications/allergies/relevent history reviewed, required imaging and test results available.  Performed  Procedure Details  The patient was NPO after midnight. Anesthesia was administered at the beside  by anesthesia with propofol.  Cardioversion was performed with synchronized biphasic defibrillation via AP pads with 120, 150 joules.  2 attempt(s) were performed.  The patient converted to normal sinus rhythm. The patient tolerated the procedure well   IMPRESSION:  Successful cardioversion of atrial fibrillation to NSR with occasional PAC's  Continue amiodarone 200 BID for 2 more weeks for one month load then decrease to 200 QD thereafter. Discussed with family.   TEE - no thrombus, mild MR/TR/AI, normal EF 55%  Kurt Atkinson 06/03/2016, 10:50 AM

## 2016-06-03 NOTE — Transfer of Care (Signed)
Immediate Anesthesia Transfer of Care Note  Patient: Kurt Atkinson  Procedure(s) Performed: Procedure(s): TRANSESOPHAGEAL ECHOCARDIOGRAM (TEE) ([name]) CARDIOVERSION ([name])  Patient Location: Endoscopy Unit  Anesthesia Type:MAC  Level of Consciousness: awake, alert  and oriented  Airway & Oxygen Therapy: Patient Spontanous Breathing and Patient connected to nasal cannula oxygen  Post-op Assessment: Report given to RN and Post -op Vital signs reviewed and stable  Post vital signs: Reviewed and stable  Last Vitals:  Vitals:   06/03/16 0840  BP: (!) 171/99  Pulse: (!) 107  Resp: 18  Temp: 36.5 C    Last Pain:  Vitals:   06/03/16 0840  TempSrc: Oral         Complications: No apparent anesthesia complications

## 2016-06-03 NOTE — Discharge Instructions (Signed)
Electrical Cardioversion, Care After °Refer to this sheet in the next few weeks. These instructions provide you with information on caring for yourself after your procedure. Your health care provider may also give you more specific instructions. Your treatment has been planned according to current medical practices, but problems sometimes occur. Call your health care provider if you have any problems or questions after your procedure. °WHAT TO EXPECT AFTER THE PROCEDURE °After your procedure, it is typical to have the following sensations: °· Some redness on the skin where the shocks were delivered. If this is tender, a sunburn lotion or hydrocortisone cream may help. °· Possible return of an abnormal heart rhythm within hours or days after the procedure. °HOME CARE INSTRUCTIONS °· Take medicines only as directed by your health care provider. Be sure you understand how and when to take your medicine. °· Learn how to feel your pulse and check it often. °· Limit your activity for 48 hours after the procedure or as directed by your health care provider. °· Avoid or minimize caffeine and other stimulants as directed by your health care provider. °SEEK MEDICAL CARE IF: °· You feel like your heart is beating too fast or your pulse is not regular. °· You have any questions about your medicines. °· You have bleeding that will not stop. °SEEK IMMEDIATE MEDICAL CARE IF: °· You are dizzy or feel faint. °· It is hard to breathe or you feel short of breath. °· There is a change in discomfort in your chest. °· Your speech is slurred or you have trouble moving an arm or leg on one side of your body. °· You get a serious muscle cramp that does not go away. °· Your fingers or toes turn cold or blue. °  °This information is not intended to replace advice given to you by your health care provider. Make sure you discuss any questions you have with your health care provider. °  °Document Released: 05/18/2013 Document Revised: 08/18/2014  Document Reviewed: 05/18/2013 °Elsevier Interactive Patient Education ©2016 [employer] °Transesophageal Echocardiogram °Transesophageal echocardiography (TEE) is a picture test of your heart using sound waves. The pictures taken can give very detailed pictures of your heart. This can help your doctor see if there are problems with your heart. TEE can check: °· If your heart has blood clots in it. °· How well your heart valves are working. °· If you have an infection on the inside of your heart. °· Some of the major arteries of your heart. °· If your heart valve is working after a repair. °· Your heart before a procedure that uses a shock to your heart to get the rhythm back to normal. °BEFORE THE PROCEDURE °· Do not eat or drink for 6 hours before the procedure or as told by your doctor. °· Make plans to have someone drive you home after the procedure. Do not drive yourself home. °· An IV tube will be put in your arm. °PROCEDURE °· You will be given a medicine to help you relax (sedative). It will be given through the IV tube. °· A numbing medicine will be sprayed or gargled in the back of your throat to help numb it. °· The tip of the probe is placed into the back of your mouth. You will be asked to swallow. This helps to pass the probe into your esophagus. °· Once the tip of the probe is in the right place, your doctor can take pictures of your heart. °· You   may feel pressure at the back of your throat. °AFTER THE PROCEDURE °· You will be taken to a recovery area so the sedative can wear off. °· Your throat may be sore and scratchy. This will go away slowly over time. °· You will go home when you are fully awake and able to swallow liquids. °· You should have someone stay with you for the next 24 hours. °· Do not drive or operate machinery for the next 24 hours. °  °This information is not intended to replace advice given to you by your health care provider. Make sure you discuss any questions you have with  your health care provider. °  °Document Released: 05/25/2009 Document Revised: 08/02/2013 Document Reviewed: 01/27/2013 °Elsevier Interactive Patient Education ©2016 [employer] ° °

## 2016-06-03 NOTE — Progress Notes (Signed)
Echocardiogram Echocardiogram Transesophageal has been performed.  Kurt Atkinson 06/03/2016, 10:58 AM

## 2016-06-03 NOTE — Anesthesia Preprocedure Evaluation (Addendum)
Anesthesia Evaluation  Patient identified by MRN, date of birth, ID band Patient awake    Reviewed: Allergy & Precautions, H&P , NPO status , Patient's Chart, lab work & pertinent test results  Airway Mallampati: II  TM Distance: >3 FB Neck ROM: Full    Dental no notable dental hx. (+) Partial Upper, Partial Lower, Dental Advisory Given   Pulmonary neg pulmonary ROS, former smoker,    Pulmonary exam normal breath sounds clear to auscultation       Cardiovascular hypertension, On Medications + CAD  + dysrhythmias Atrial Fibrillation  Rhythm:Irregular Rate:Tachycardia     Neuro/Psych negative neurological ROS  negative psych ROS   GI/Hepatic negative GI ROS, Neg liver ROS,   Endo/Other  negative endocrine ROS  Renal/GU negative Renal ROS  negative genitourinary   Musculoskeletal   Abdominal   Peds  Hematology negative hematology ROS (+)   Anesthesia Other Findings   Reproductive/Obstetrics negative OB ROS                            Anesthesia Physical Anesthesia Plan  ASA: III  Anesthesia Plan: MAC   Post-op Pain Management:    Induction: Intravenous  Airway Management Planned: Nasal Cannula  Additional Equipment:   Intra-op Plan:   Post-operative Plan:   Informed Consent: I have reviewed the patients History and Physical, chart, labs and discussed the procedure including the risks, benefits and alternatives for the proposed anesthesia with the patient or authorized representative who has indicated his/her understanding and acceptance.   Dental advisory given  Plan Discussed with: CRNA  Anesthesia Plan Comments:         Anesthesia Quick Evaluation

## 2016-06-04 ENCOUNTER — Encounter (HOSPITAL_COMMUNITY): Payer: Self-pay | Admitting: Cardiology

## 2016-06-04 NOTE — Patient Instructions (Signed)
Continue with plan for Cardioversion.

## 2016-07-08 ENCOUNTER — Telehealth: Payer: Self-pay | Admitting: Interventional Cardiology

## 2016-07-08 NOTE — Telephone Encounter (Signed)
Agree. Agree.

## 2016-07-08 NOTE — Telephone Encounter (Signed)
Notified the pt that per Dr Marlou Porch discharge note (as mentioned below) with the pt, post cardioversion on 10/24, he should continue amiodarone 200 mg po BID for 2 more weeks (to make one month of loading on this med), then decrease this to amiodarone 200 mg po daily thereafter.   Informed the pt that it is safe for him to go ahead and decrease his amiodarone to 200 mg po daily, as instructed by Dr Marlou Porch.   Pt states he has enough amio on hand at this time, and will require no refills at this time.   Pt verbalized understanding and agrees with this plan.   Will route this message to Dr Tamala Julian and Covering Nurse, as an Juluis Rainier about pt transitioning to a decreased dose of amiodarone, as advised.        Electrical Cardioversion Procedure Note Kurt Atkinson [id number] Nov 11, 1931  Procedure: Electrical Cardioversion Indications:  Atrial Fibrillation  Time Out: Verified patient identification, verified procedure,medications/allergies/relevent history reviewed, required imaging and test results available.  Performed  Procedure Details  The patient was NPO after midnight. Anesthesia was administered at the beside  by anesthesia with propofol.  Cardioversion was performed with synchronized biphasic defibrillation via AP pads with 120, 150 joules.  2 attempt(s) were performed.  The patient converted to normal sinus rhythm. The patient tolerated the procedure well   IMPRESSION:  Successful cardioversion of atrial fibrillation to NSR with occasional PAC's  Continue amiodarone 200 BID for 2 more weeks for one month load then decrease to 200 QD thereafter. Discussed with family.   TEE - no thrombus, mild MR/TR/AI, normal EF 55%  Kurt Atkinson 06/03/2016, 10:50 AM

## 2016-07-08 NOTE — Telephone Encounter (Signed)
New Message:   Pt had a Cardioversion on 06-03-16. He was put on 400 mg of Amiodarone since 05-19-16. Pt would like to know if he can cut back now to 200 mg of Amiodarone a day please?

## 2016-10-21 ENCOUNTER — Encounter: Payer: Self-pay | Admitting: Interventional Cardiology

## 2016-10-26 NOTE — Progress Notes (Signed)
Cardiology Office Note    Date:  10/27/2016   ID:  Kurt Atkinson, DOB Nov 17, 1931, MRN [id number]  PCP:  Sinclair Grooms, MD  Cardiologist: Sinclair Grooms, MD   Chief Complaint  Patient presents with  . Atrial Fibrillation  . Coronary Artery Disease    History of Present Illness:  Kurt Atkinson is a 83 y.o. male who presents for CAD with prior bypass surgery, hypertension, chronic diastolic heart failure, paroxysmal atrial fibrillation with most recent cardioversion 05/2016, and chronic anticoagulation therapy on Coumadin.  Wallis is doing well. He underwent electrical cardioversion in October. He has felt well since that time. Image level is back to baseline. No bleeding complications on Coumadin. He does inquire about being switched to a NOAC.He wants a prescription so that he can price the medication at his local pharmacies in [place]. He denies orthopnea, PND, and chest pain.    Past Medical History:  Diagnosis Date  . Atrial fibrillation ([place]) 01/05/2014   Rhythm control with amiodarone   . CAD (coronary artery disease) of artery bypass graft 01/05/2014   CABG,, 2001   . Chronic anticoagulation 01/05/2014   Coumadin   . Essential hypertension 01/05/2014  . Hyperlipidemia 01/05/2014    Past Surgical History:  Procedure Laterality Date  . CARDIOVERSION [name] 06/03/2016   Procedure: CARDIOVERSION;  Surgeon: Jerline Pain, MD;  Location: [facility] ENDOSCOPY;  Service: Cardiovascular;  Laterality: [name];  . CORONARY ARTERY BYPASS GRAFT    . TEE WITHOUT CARDIOVERSION [name] 06/03/2016   Procedure: TRANSESOPHAGEAL ECHOCARDIOGRAM (TEE);  Surgeon: Jerline Pain, MD;  Location: [facility] ENDOSCOPY;  Service: Cardiovascular;  Laterality: [name];    Current Medications: Outpatient Medications Prior to Visit  Medication Sig Dispense Refill  . amiodarone (PACERONE) 200 MG tablet Take 1 tablet (200 mg total) by mouth daily.    Marland Kitchen amLODipine (NORVASC) 5 MG tablet Take 1 tablet by mouth daily.     Marland Kitchen levothyroxine (SYNTHROID, LEVOTHROID) 200 MCG tablet Take 100 mcg by mouth daily before breakfast.    . primidone (MYSOLINE) 50 MG tablet Take 50 mg by mouth daily.    . simvastatin (ZOCOR) 40 MG tablet Take 20 mg by mouth daily.    Marland Kitchen warfarin (COUMADIN) 3 MG tablet Take 3 mg by mouth as directed.    . warfarin (COUMADIN) 4 MG tablet Take 4 mg by mouth daily.     Facility-Administered Medications Prior to Visit  Medication Dose Route Frequency Provider Last Rate Last Dose  . 0.9 %  sodium chloride infusion  250 mL Intravenous Continuous Belva Crome, MD      . sodium chloride flush (NS) 0.9 % injection 3 mL  3 mL Intravenous Q12H Belva Crome, MD      . sodium chloride flush (NS) 0.9 % injection 3 mL  3 mL Intravenous PRN Belva Crome, MD         Allergies:   Penicillins and Shrimp [shellfish allergy]   Social History   Social History  . Marital status: Married    Spouse name: [name]  . Number of children: [name]  . Years of education: [name]   Social History Main Topics  . Smoking status: Former Research scientist (life sciences)  . Smokeless tobacco: Never Used  . Alcohol use No  . Drug use: No  . Sexual activity: Not Asked   Other Topics Concern  . None   Social History Narrative  . None     Family History:  The patient's  family history includes Appendicitis in his sister; Arthritis in his father and mother; Cancer in his sister; Diabetes in his brother; Healthy in his sister; Heart disease in his brother, brother, father, mother, sister, and sister.   ROS:   Please see the history of present illness.    No head trauma, bleeding, syncope, or transient neurological symptoms.  All other systems reviewed and are negative.   PHYSICAL EXAM:   VS:  BP 132/60   Pulse (!) 55   Ht 6\' 2"  (1.88 m)   Wt 181 lb (82.1 kg)   SpO2 97%   BMI 23.24 kg/m    GEN: Well nourished, well developed, in no acute distress  HEENT: normal  Neck: no JVD, carotid bruits, or masses Cardiac: RRR; no murmurs, rubs, or  gallops,no edema  Respiratory:  clear to auscultation bilaterally, normal work of breathing GI: soft, nontender, nondistended, + BS MS: no deformity or atrophy  Skin: warm and dry, no rash Neuro:  Alert and Oriented x 3, Strength and sensation are intact Psych: euthymic mood, full affect  Wt Readings from Last 3 Encounters:  10/27/16 181 lb (82.1 kg)  06/03/16 181 lb (82.1 kg)  05/30/16 181 lb (82.1 kg)      Studies/Labs Reviewed:   EKG:  EKG  Not done.  Recent Labs: 05/30/2016: Hemoglobin 13.9; Potassium 4.6; Sodium 143   Lipid Panel No results found for: CHOL, TRIG, HDL, CHOLHDL, VLDL, LDLCALC, LDLDIRECT  Additional studies/ records that were reviewed today include:  No new data  Transesophageal echocardiogram performed 06/03/16: Study Conclusions  - Left ventricle: Systolic function was normal. The estimated   ejection fraction was in the range of 55% to 60%. - Aortic valve: No evidence of vegetation. There was mild   regurgitation. - Mitral valve: No evidence of vegetation. There was mild   regurgitation. - Left atrium: No evidence of thrombus in the atrial cavity or   appendage. - Right atrium: No evidence of thrombus in the atrial cavity or   appendage. - Atrial septum: No defect or patent foramen ovale was identified. - Tricuspid valve: No evidence of vegetation. - Pulmonic valve: No evidence of vegetation. - Superior vena cava: The study excluded a thrombus.  ASSESSMENT:    1. Paroxysmal atrial fibrillation (HCC)   2. Coronary artery disease involving coronary bypass graft of native heart with angina pectoris ([place])   3. Essential hypertension   4. Chronic anticoagulation   5. History of amiodarone therapy   6. Other hyperlipidemia      PLAN:  In order of problems listed above:  1. Now back in sinus rhythm and stable after cardioversion late last year. We will 100 mg of amiodarone at that time. The dose has been increased to 200 mg daily which we  will continue indefinitely. 2. Asymptomatic with reference to anginal complaints with physical activity. Nitroglycerin use is very rare. 3. Blood pressure is under excellent control. 2 g sodium diet. 4. Weigh the pros and cons of Eliquis v Coumadin. We gave him a prescription for eloquent's 5 mg twice a day which he will price out at drug stores near him. He may decide to switch over depending upon the cost. 5. Amiodarone therapy with lab work and chest x-rays being followed by his primary physician in [place], Dr. Damita Dunnings. We will continue this patent. The patient will return to see me in one year.  Clinical follow-up in one year. Amiodarone toxicity monitoring with TSH hepatic panel every 6 months  and chest x-ray every 12 months by Dr. Damita Dunnings in [place] with information being faxed here for review  Medication Adjustments/Labs and Tests Ordered: Current medicines are reviewed at length with the patient today.  Concerns regarding medicines are outlined above.  Medication changes, Labs and Tests ordered today are listed in the Patient Instructions below. Patient Instructions  Medication Instructions:  None  Labwork: None  Testing/Procedures: None  Follow-Up: Your physician wants you to follow-up in: 1 year with Dr. Tamala Julian.  You will receive a reminder letter in the mail two months in advance. If you don't receive a letter, please call our office to schedule the follow-up appointment.   Any Other Special Instructions Will Be Listed Below (If Applicable).     If you need a refill on your cardiac medications before your next appointment, please call your pharmacy.      Signed, Sinclair Grooms, MD  10/27/2016 4:57 PM    [facility] [address], [place], [place]  [address] Phone: [phone]; Fax: [phone]

## 2016-10-27 ENCOUNTER — Ambulatory Visit (INDEPENDENT_AMBULATORY_CARE_PROVIDER_SITE_OTHER): Payer: Medicare Other | Admitting: Interventional Cardiology

## 2016-10-27 ENCOUNTER — Encounter: Payer: Self-pay | Admitting: Interventional Cardiology

## 2016-10-27 VITALS — BP 132/60 | HR 55 | Ht 74.0 in | Wt 181.0 lb

## 2016-10-27 DIAGNOSIS — I10 Essential (primary) hypertension: Secondary | ICD-10-CM | POA: Diagnosis not present

## 2016-10-27 DIAGNOSIS — E7849 Other hyperlipidemia: Secondary | ICD-10-CM

## 2016-10-27 DIAGNOSIS — I25709 Atherosclerosis of coronary artery bypass graft(s), unspecified, with unspecified angina pectoris: Secondary | ICD-10-CM

## 2016-10-27 DIAGNOSIS — Z7901 Long term (current) use of anticoagulants: Secondary | ICD-10-CM

## 2016-10-27 DIAGNOSIS — I480 Paroxysmal atrial fibrillation: Secondary | ICD-10-CM | POA: Diagnosis not present

## 2016-10-27 DIAGNOSIS — Z9229 Personal history of other drug therapy: Secondary | ICD-10-CM

## 2016-10-27 DIAGNOSIS — E784 Other hyperlipidemia: Secondary | ICD-10-CM

## 2016-10-27 NOTE — Patient Instructions (Signed)

## 2017-02-25 ENCOUNTER — Telehealth: Payer: Self-pay | Admitting: Interventional Cardiology

## 2017-02-25 LAB — PROTIME-INR

## 2017-02-25 NOTE — Telephone Encounter (Signed)
New message      Pt saw his PCP today.  His pulse was between 110-120.  Pt said his pcp told him to call Dr Tamala Julian to see if he wanted to put him on a beta blocker.  Please call

## 2017-02-25 NOTE — Telephone Encounter (Signed)
How long has HR been up?? Need to see ECG. Increase the Amiodarone to 200 mg BID for 1 week then back to 200 mg daily. Add metoprolol tartrate 25 mg BID.  Need BP and HR recordings daily. If SOB or low HR( < 55 bpm) or BP(< 720 mmHg systolic) please call or report to ER. Once HR decreases go back to baseline therapy

## 2017-02-25 NOTE — Telephone Encounter (Signed)
Pt seen by PCP, Dr. Damita Dunnings, today.  PCP advised for pt to call our office to see if Dr. Tamala Julian wanted to put pt on a beta blocker.  Pt states PCP did EKG and told him he was not in Afib but his HR was 110-120.  Currently takes Amiodarone 200mg  QD.  Denies SOB or feelings of fluttering but says he could tell his HR was up some.  States he does get lightheaded if he is up walking around too much but it resolves with rest.  Pt states PCP faxed over EKG to our office.  Checked and this has not been received yet.  Advised I will send message to Dr. Tamala Julian for review and advisement.  Pt appreciative for call.

## 2017-02-26 MED ORDER — METOPROLOL TARTRATE 25 MG PO TABS: 25.0000 mg | ORAL_TABLET | Freq: Two times a day (BID) | ORAL | 3 refills | Status: DC

## 2017-02-26 NOTE — Telephone Encounter (Signed)
Spoke with pt and made him aware of recommendations per Dr. Tamala Julian.  Pt verbalized understanding and was in agreement with this plan.  Spoke with Sia at Dr. Josefine Class office and made her aware of orders per Dr. Tamala Julian.  She will contact pt to schedule him to come in for labs and repeat EKG in 10 days.  Provided fax number to send to our office once this is completed.

## 2017-02-26 NOTE — Telephone Encounter (Signed)
Spoke with pt and he states that his HR has been up like this for about 3-4 days.  Advised him of recommendations per Dr. Tamala Julian.  Pt verbalized understanding and was in agreement with this plan.  EKG from PCP was received and has been scanned into the Media tab for Dr. Tamala Julian to review.  Will route to Dr. Tamala Julian to look at EKG and see if further recommendations.  Advised pt I would call if he further recommendations.

## 2017-02-26 NOTE — Telephone Encounter (Signed)
He is in atrial flutter, the cousin of atrial fibrillation. Continue the increased dose of amio for 10 days or until HR < 90 bpm which ever happens first.  Needs INR done Monday or Tuesday, as higher amio dose will increase. Coumadin dose may need adjusting. If still out of rhythm in 10 days, will need cardioversion. Needs BMET with INR next week. Can be done with DR. Demski but I need the results faxed to me.

## 2017-03-02 LAB — PROTIME-INR

## 2017-03-03 ENCOUNTER — Telehealth: Payer: Self-pay | Admitting: Interventional Cardiology

## 2017-03-03 NOTE — Telephone Encounter (Signed)
Kurt Atkinson is calling to give a update on his blood pressure readings . Please call

## 2017-03-03 NOTE — Telephone Encounter (Signed)
Pt states that he went to PCP office yesterday and had labs drawn and a repeat EKG.  BP today 102/81, HR 90.  Pt states he feels about the same as he did when Aflutter was discovered.  Denies SOB or fatigue.  Advised once EKG and labs received and reviewed by Dr. Tamala Julian I would call with his recommendations.  Will route to Dr. Tamala Julian to make him aware.

## 2017-03-04 NOTE — Telephone Encounter (Signed)
Spoke with pt and made him aware of recommendations per Dr. Tamala Julian (See EKG scanned in from 03/02/17) .  Scheduled pt to see Robbie Lis, PA-C 03/12/17.  Pt verbalized understanding and was in agreement with this plan.

## 2017-03-04 NOTE — Telephone Encounter (Signed)
Left message to call back  

## 2017-03-04 NOTE — Telephone Encounter (Signed)
Agree 

## 2017-03-11 NOTE — Progress Notes (Signed)
Cardiology Office Note    Date:  03/12/2017   ID:  Kurt Atkinson, DOB 24-Oct-1931, MRN [id number]  PCP:  Belva Crome, MD  Cardiologist:  Dr. Tamala Julian  Chief Complaint: Afib  History of Present Illness:   Kurt Atkinson is a 83 y.o. male with hx of CAD s/p CABG,  hypertension, chronic diastolic heart failure, paroxysmal atrial fibrillation with most recent cardioversion 05/2016, HTN and HLD presents for afib follow up.   Recently noted in A. Flutter during the PCP visit 02/25/17. Added beta blocker and increased amiodarone to 200mg  BID for 1 week then 200mg  daily.   Here today for follow up. Baseline rate in 80s. With ambulation it goes to 100-110s. He does compliant of dizziness and fatigue with ambulation. No syncope, Chest pain, dyspnea, lower extremity edema, orthopnea, PND or melena. Compliant with medication. During last cardioversion in 2017 he required 200 mg twice a day amiodarone for longer duration.  Past Medical History:  Diagnosis Date  . Atrial fibrillation ([place]) 01/05/2014   Rhythm control with amiodarone   . CAD (coronary artery disease) of artery bypass graft 01/05/2014   CABG,, 2001   . Chronic anticoagulation 01/05/2014   Coumadin   . Essential hypertension 01/05/2014  . Hyperlipidemia 01/05/2014    Past Surgical History:  Procedure Laterality Date  . CARDIOVERSION [name] 06/03/2016   Procedure: CARDIOVERSION;  Surgeon: Jerline Pain, MD;  Location: [facility] ENDOSCOPY;  Service: Cardiovascular;  Laterality: [name];  . CORONARY ARTERY BYPASS GRAFT    . TEE WITHOUT CARDIOVERSION [name] 06/03/2016   Procedure: TRANSESOPHAGEAL ECHOCARDIOGRAM (TEE);  Surgeon: Jerline Pain, MD;  Location: [facility] ENDOSCOPY;  Service: Cardiovascular;  Laterality: [name];    Current Medications: Prior to Admission medications   Medication Sig Start Date End Date Taking? Authorizing Provider  amiodarone (PACERONE) 200 MG tablet Take 1 tablet (200 mg total) by mouth daily. 04/30/15   Belva Crome, MD    amLODipine (NORVASC) 5 MG tablet Take 1 tablet by mouth daily. 12/07/13   [provider]  levothyroxine (SYNTHROID, LEVOTHROID) 200 MCG tablet Take 100 mcg by mouth daily before breakfast.    [provider]  metoprolol tartrate (LOPRESSOR) 25 MG tablet Take 1 tablet (25 mg total) by mouth 2 (two) times daily. 02/26/17 05/27/17  Belva Crome, MD  primidone (MYSOLINE) 50 MG tablet Take 50 mg by mouth daily.    [provider]  simvastatin (ZOCOR) 40 MG tablet Take 20 mg by mouth daily.    [provider]  warfarin (COUMADIN) 3 MG tablet Take 3 mg by mouth as directed. 10/12/15   [provider]    Allergies:   Penicillins and Shrimp [shellfish allergy]   Social History   Social History  . Marital status: Married    Spouse name: [name]  . Number of children: [name]  . Years of education: [name]   Social History Main Topics  . Smoking status: Former Research scientist (life sciences)  . Smokeless tobacco: Never Used  . Alcohol use No  . Drug use: No  . Sexual activity: Not Asked   Other Topics Concern  . None   Social History Narrative  . None     Family History:  The patient's family history includes Appendicitis in his sister; Arthritis in his father and mother; Cancer in his sister; Diabetes in his brother; Healthy in his sister; Heart disease in his brother, brother, father, mother, sister, and sister.  ROS:   Please see the history of present illness.  ROS All other systems reviewed and are negative.   PHYSICAL EXAM:   VS:  BP 118/74   Pulse 98   Ht 6\' 2"  (1.88 m)   Wt 180 lb (81.6 kg)   SpO2 96%   BMI 23.11 kg/m    GEN: Well nourished, well developed, in no acute distress  HEENT: normal  Neck: no JVD, carotid bruits, or masses Cardiac:RRR; no murmurs, rubs, or gallops,no edema  Respiratory:  clear to auscultation bilaterally, normal work of breathing GI: soft, nontender, nondistended, + BS MS: no deformity or atrophy  Skin: warm and dry, no  rash Neuro:  Alert and Oriented x 3, Strength and sensation are intact Psych: euthymic mood, full affect  Wt Readings from Last 3 Encounters:  03/12/17 180 lb (81.6 kg)  10/27/16 181 lb (82.1 kg)  06/03/16 181 lb (82.1 kg)      Studies/Labs Reviewed:   EKG:  EKG is ordered today.  The ekg ordered today demonstrates aflutter at rate of 98  Recent Labs: 05/30/2016: Hemoglobin 13.9; Potassium 4.6; Sodium 143   Lipid Panel No results found for: CHOL, TRIG, HDL, CHOLHDL, VLDL, LDLCALC, LDLDIRECT  Additional studies/ records that were reviewed today include:   TEE: 06/03/16 Study Conclusions  - Left ventricle: Systolic function was normal. The estimated   ejection fraction was in the range of 55% to 60%. - Aortic valve: No evidence of vegetation. There was mild   regurgitation. - Mitral valve: No evidence of vegetation. There was mild   regurgitation. - Left atrium: No evidence of thrombus in the atrial cavity or   appendage. - Right atrium: No evidence of thrombus in the atrial cavity or   appendage. - Atrial septum: No defect or patent foramen ovale was identified. - Tricuspid valve: No evidence of vegetation. - Pulmonic valve: No evidence of vegetation. - Superior vena cava: The study excluded a thrombus.   ASSESSMENT & PLAN:    1. Paroxysmal atrial fibrillation/flutter - Still in aflutter. Symptomatic with ambulation. Stop Norvasc. Increase metoprolol to 50mg  BID. Continue amiodarone 200mg  BID.  - His INR was 1.9 on 7/18 & 2.3 on 7/23. Will check again today in clinic. He will get INR at PCP office 8/9 & 8/16 (with EKG). If still in aflutter and therapeutic INR--> will schedule DCCV. If worsen symptoms, go to ER. Continue coumadin per PCP.  Reviewed with Dr. Marlou Porch (DOD).   2. CAD s/p CABG - No angina  3. HLD - followed by PCP. Continue statin   4. HTN - BP well controlled. Medication changes as above.     Medication Adjustments/Labs and Tests  Ordered: Current medicines are reviewed at length with the patient today.  Concerns regarding medicines are outlined above.  Medication changes, Labs and Tests ordered today are listed in the Patient Instructions below. Patient Instructions  Medication Instructions: Your physician has recommended you make the following change in your medication:     Labwork: TODAY:  PT/INR  Testing/Procedures: None ordered  Follow-Up: Your physician recommends that you schedule a follow-up appointment in:    Any Other Special Instructions Will Be Listed Below (If Applicable).     If you need a refill on your cardiac medications before your next appointment, please call your pharmacy.      Jarrett Soho, [place]  03/12/2017 12:14 PM    [facility] [address], [place], [place]  [address] Phone: [phone]; Fax: [phone]

## 2017-03-12 ENCOUNTER — Ambulatory Visit (INDEPENDENT_AMBULATORY_CARE_PROVIDER_SITE_OTHER): Payer: Medicare Other | Admitting: Physician Assistant

## 2017-03-12 ENCOUNTER — Encounter: Payer: Self-pay | Admitting: Physician Assistant

## 2017-03-12 VITALS — BP 118/74 | HR 98 | Ht 74.0 in | Wt 180.0 lb

## 2017-03-12 DIAGNOSIS — I10 Essential (primary) hypertension: Secondary | ICD-10-CM | POA: Diagnosis not present

## 2017-03-12 DIAGNOSIS — I25709 Atherosclerosis of coronary artery bypass graft(s), unspecified, with unspecified angina pectoris: Secondary | ICD-10-CM

## 2017-03-12 DIAGNOSIS — Z7901 Long term (current) use of anticoagulants: Secondary | ICD-10-CM

## 2017-03-12 DIAGNOSIS — Z9229 Personal history of other drug therapy: Secondary | ICD-10-CM

## 2017-03-12 DIAGNOSIS — I480 Paroxysmal atrial fibrillation: Secondary | ICD-10-CM

## 2017-03-12 LAB — PROTIME-INR
INR: 2.3 — AB (ref 0.8–1.2)
PROTHROMBIN TIME: 22.5 s — AB (ref 9.1–12.0)

## 2017-03-12 MED ORDER — AMIODARONE HCL 200 MG PO TABS: 200.0000 mg | ORAL_TABLET | Freq: Two times a day (BID) | ORAL | 0 refills | Status: DC

## 2017-03-12 MED ORDER — METOPROLOL TARTRATE 50 MG PO TABS: 50.0000 mg | ORAL_TABLET | Freq: Two times a day (BID) | ORAL | 3 refills | Status: DC

## 2017-03-12 NOTE — Patient Instructions (Addendum)
Medication Instructions: Your physician has recommended you make the following change in your medication:  1.  STOP the Amlodipine 2.  INCREASE the Lopressor to 50 mg taking 1 tablet twice a day 3.  CONTINUE the Amiodarone to 200 mg taking 1 tablet twice a day   YOU WILL NEED TO GO TO YOUR PRIMARY CARE DR AND HAVE ANOTHER PT/INR 03/19/17 AND 03/26/17 ALONG WITH AN EKG ON 03/26/17 AND HAVE THEM FAX [place] THE RESULTS TO [phone] TO ATTN: Anderson Malta W. THEN WE WILL DECIDE ON WHETHER WE WILL SET UP CARDIOVERSION OR NOT.  Labwork: TODAY:  PT/INR  Testing/Procedures: None ordered  Follow-Up: Your physician recommends that you schedule a follow-up appointment in:    Any Other Special Instructions Will Be Listed Below (If Applicable).     If you need a refill on your cardiac medications before your next appointment, please call your pharmacy.

## 2017-03-26 ENCOUNTER — Telehealth: Payer: Self-pay | Admitting: Interventional Cardiology

## 2017-03-26 DIAGNOSIS — I480 Paroxysmal atrial fibrillation: Secondary | ICD-10-CM

## 2017-03-26 DIAGNOSIS — Z01812 Encounter for preprocedural laboratory examination: Secondary | ICD-10-CM

## 2017-03-26 LAB — PROTIME-INR

## 2017-03-26 NOTE — Telephone Encounter (Signed)
Spoke with pt and made him aware that EKG and INR were received.  Advised I would have Dr. Tamala Julian look over everything and call back with recommendations.  Pt appreciative for call.

## 2017-03-26 NOTE — Telephone Encounter (Signed)
New message    Pt was calling to check to see if you got the EKG from his primary doctor

## 2017-03-27 ENCOUNTER — Encounter: Payer: Self-pay | Admitting: *Deleted

## 2017-03-27 MED ORDER — AMIODARONE HCL 200 MG PO TABS: 200.0000 mg | ORAL_TABLET | Freq: Every day | ORAL | 1 refills | Status: DC

## 2017-03-27 NOTE — Telephone Encounter (Signed)
Pt called back to let me know he can come for his DCCV on 8/22.  Advised I would call and get this scheduled and call back with instructions.  Pt appreciative for assistance.

## 2017-03-27 NOTE — Telephone Encounter (Signed)
Spoke with pt and made him aware that Dr. Tamala Julian looked over everything and pt will need to come next week for a Cardioversion.  Pt will contact daughter to see when she is off next week and call me back with dates.

## 2017-03-27 NOTE — Telephone Encounter (Signed)
Spoke with pt and went over instructions for DCCV.  Dr Tamala Julian would also like for pt to decrease Amio to 200mg  QD.  Pt will come for labs the day prior to DCCV.  Pt verbalized understanding of all instructions and was appreciative for assistance.

## 2017-03-27 NOTE — Telephone Encounter (Signed)
Follow up      Pt has another question for the nurse.  Please call

## 2017-03-31 ENCOUNTER — Other Ambulatory Visit: Payer: Medicare Other | Admitting: *Deleted

## 2017-03-31 DIAGNOSIS — I480 Paroxysmal atrial fibrillation: Secondary | ICD-10-CM

## 2017-03-31 DIAGNOSIS — Z01812 Encounter for preprocedural laboratory examination: Secondary | ICD-10-CM

## 2017-03-31 NOTE — Addendum Note (Signed)
Addended by: Loren Racer on: 03/31/2017 02:01 PM   Modules accepted: Orders

## 2017-04-01 ENCOUNTER — Ambulatory Visit (HOSPITAL_COMMUNITY): Payer: Medicare Other | Admitting: Certified Registered Nurse Anesthetist

## 2017-04-01 ENCOUNTER — Encounter (HOSPITAL_COMMUNITY): Payer: Self-pay

## 2017-04-01 ENCOUNTER — Encounter (HOSPITAL_COMMUNITY)
Admission: RE | Disposition: A | Discharge status: Home / Self Care | Payer: Self-pay | Source: Ambulatory Visit | Attending: Cardiology

## 2017-04-01 ENCOUNTER — Ambulatory Visit (HOSPITAL_COMMUNITY)
Admission: RE | Admit: 2017-04-01 | Discharge: 2017-04-01 | Disposition: A | Discharge status: Home / Self Care | Payer: Medicare Other | Source: Ambulatory Visit | Attending: Cardiology | Admitting: Cardiology

## 2017-04-01 DIAGNOSIS — I10 Essential (primary) hypertension: Secondary | ICD-10-CM | POA: Diagnosis not present

## 2017-04-01 DIAGNOSIS — E785 Hyperlipidemia, unspecified: Secondary | ICD-10-CM | POA: Insufficient documentation

## 2017-04-01 DIAGNOSIS — Z87891 Personal history of nicotine dependence: Secondary | ICD-10-CM | POA: Diagnosis not present

## 2017-04-01 DIAGNOSIS — I4891 Unspecified atrial fibrillation: Secondary | ICD-10-CM | POA: Diagnosis not present

## 2017-04-01 DIAGNOSIS — Z79899 Other long term (current) drug therapy: Secondary | ICD-10-CM | POA: Insufficient documentation

## 2017-04-01 DIAGNOSIS — I480 Paroxysmal atrial fibrillation: Secondary | ICD-10-CM | POA: Insufficient documentation

## 2017-04-01 DIAGNOSIS — I25810 Atherosclerosis of coronary artery bypass graft(s) without angina pectoris: Secondary | ICD-10-CM | POA: Diagnosis not present

## 2017-04-01 DIAGNOSIS — Z7901 Long term (current) use of anticoagulants: Secondary | ICD-10-CM | POA: Diagnosis not present

## 2017-04-01 HISTORY — PX: CARDIOVERSION: SHX1299

## 2017-04-01 LAB — PROTIME-INR
INR: 2.4 — ABNORMAL HIGH (ref 0.8–1.2)
Prothrombin Time: 23.6 s — ABNORMAL HIGH (ref 9.1–12.0)

## 2017-04-01 LAB — CBC
Hematocrit: 41.3 % (ref 37.5–51.0)
Hemoglobin: 13.6 g/dL (ref 13.0–17.7)
MCH: 30 pg (ref 26.6–33.0)
MCHC: 32.9 g/dL (ref 31.5–35.7)
MCV: 91 fL (ref 79–97)
PLATELETS: 132 10*3/uL — AB (ref 150–379)
RBC: 4.53 x10E6/uL (ref 4.14–5.80)
RDW: 15.4 % (ref 12.3–15.4)
WBC: 10.3 10*3/uL (ref 3.4–10.8)

## 2017-04-01 LAB — BASIC METABOLIC PANEL
BUN / CREAT RATIO: 17 (ref 10–24)
BUN: 34 mg/dL — ABNORMAL HIGH (ref 8–27)
CO2: 21 mmol/L (ref 20–29)
CREATININE: 1.97 mg/dL — AB (ref 0.76–1.27)
Calcium: 8.6 mg/dL (ref 8.6–10.2)
Chloride: 108 mmol/L — ABNORMAL HIGH (ref 96–106)
GFR calc Af Amer: 36 mL/min/{1.73_m2} — ABNORMAL LOW (ref >59)
GFR, EST NON AFRICAN AMERICAN: 31 mL/min/{1.73_m2} — AB (ref >59)
Glucose: 84 mg/dL (ref 65–99)
Potassium: 4.6 mmol/L (ref 3.5–5.2)
SODIUM: 144 mmol/L (ref 134–144)

## 2017-04-01 SURGERY — CARDIOVERSION
Anesthesia: General

## 2017-04-01 MED ORDER — PROPOFOL 10 MG/ML IV BOLUS
INTRAVENOUS | Status: AC
  Filled 2017-04-01: qty 40

## 2017-04-01 MED ORDER — METOPROLOL TARTRATE 50 MG PO TABS: 25.0000 mg | ORAL_TABLET | Freq: Two times a day (BID) | ORAL | 3 refills | Status: DC

## 2017-04-01 MED ORDER — SODIUM CHLORIDE 0.9 % IV SOLN
INTRAVENOUS | Status: DC | PRN
  Administered 2017-04-01: 13:00:00 via INTRAVENOUS

## 2017-04-01 MED ORDER — PROPOFOL 10 MG/ML IV BOLUS
INTRAVENOUS | Status: DC | PRN
  Administered 2017-04-01: 50 mg via INTRAVENOUS

## 2017-04-01 MED ORDER — PROTAMINE SULFATE 10 MG/ML IV SOLN
INTRAVENOUS | Status: AC
  Filled 2017-04-01: qty 5

## 2017-04-01 MED ORDER — LIDOCAINE 2% (20 MG/ML) 5 ML SYRINGE
INTRAMUSCULAR | Status: DC | PRN
  Administered 2017-04-01: 60 mg via INTRAVENOUS

## 2017-04-01 MED ORDER — LIDOCAINE 2% (20 MG/ML) 5 ML SYRINGE
INTRAMUSCULAR | Status: AC
  Filled 2017-04-01: qty 10

## 2017-04-01 MED ORDER — PROTAMINE SULFATE 10 MG/ML IV SOLN
INTRAVENOUS | Status: AC
  Filled 2017-04-01: qty 25

## 2017-04-01 NOTE — Anesthesia Preprocedure Evaluation (Addendum)
Anesthesia Evaluation  Patient identified by MRN, date of birth, ID band Patient awake    Reviewed: Allergy & Precautions, NPO status , Patient's Chart, lab work & pertinent test results  Airway Mallampati: I   Neck ROM: Full    Dental  (+) Teeth Intact, Partial Upper, Dental Advisory Given   Pulmonary former smoker,    Pulmonary exam normal breath sounds clear to auscultation       Cardiovascular hypertension, + CAD  + dysrhythmias Atrial Fibrillation  Rhythm:Irregular Rate:Normal     Neuro/Psych    GI/Hepatic   Endo/Other    Renal/GU      Musculoskeletal   Abdominal   Peds  Hematology   Anesthesia Other Findings   Reproductive/Obstetrics                            Anesthesia Physical Anesthesia Plan  ASA: III  Anesthesia Plan: General   Post-op Pain Management:    Induction: Intravenous  PONV Risk Score and Plan:   Airway Management Planned: Natural Airway and Mask  Additional Equipment:   Intra-op Plan:   Post-operative Plan:   Informed Consent: I have reviewed the patients History and Physical, chart, labs and discussed the procedure including the risks, benefits and alternatives for the proposed anesthesia with the patient or authorized representative who has indicated his/her understanding and acceptance.   Dental advisory given  Plan Discussed with: CRNA, Anesthesiologist and Surgeon  Anesthesia Plan Comments:         Anesthesia Quick Evaluation

## 2017-04-01 NOTE — Discharge Instructions (Signed)
Electrical Cardioversion, Care After °This sheet gives you information about how to care for yourself after your procedure. Your health care provider may also give you more specific instructions. If you have problems or questions, contact your health care provider. °What can I expect after the procedure? °After the procedure, it is common to have: °· Some redness on the skin where the shocks were given. ° °Follow these instructions at home: °· Do not drive for 24 hours if you were given a medicine to help you relax (sedative). °· Take over-the-counter and prescription medicines only as told by your health care provider. °· Ask your health care provider how to check your pulse. Check it often. °· Rest for 48 hours after the procedure or as told by your health care provider. °· Avoid or limit your caffeine use as told by your health care provider. °Contact a health care provider if: °· You feel like your heart is beating too quickly or your pulse is not regular. °· You have a serious muscle cramp that does not go away. °Get help right away if: °· You have discomfort in your chest. °· You are dizzy or you feel faint. °· You have trouble breathing or you are short of breath. °· Your speech is slurred. °· You have trouble moving an arm or leg on one side of your body. °· Your fingers or toes turn cold or blue. °This information is not intended to replace advice given to you by your health care provider. Make sure you discuss any questions you have with your health care provider. °Document Released: 05/18/2013 Document Revised: 02/29/2016 Document Reviewed: 02/01/2016 °Elsevier Interactive Patient Education © 2018 [employer]vier Inc[employer] ° °

## 2017-04-01 NOTE — H&P (View-Only) (Signed)
Cardiology Office Note    Date:  03/12/2017   ID:  Kurt Atkinson, DOB 1931/12/15, MRN [id number]  PCP:  Belva Crome, MD  Cardiologist:  Dr. Tamala Julian  Chief Complaint: Afib  History of Present Illness:   Kurt Atkinson is a 83 y.o. male with hx of CAD s/p CABG,  hypertension, chronic diastolic heart failure, paroxysmal atrial fibrillation with most recent cardioversion 05/2016, HTN and HLD presents for afib follow up.   Recently noted in A. Flutter during the PCP visit 02/25/17. Added beta blocker and increased amiodarone to 200mg  BID for 1 week then 200mg  daily.   Here today for follow up. Baseline rate in 80s. With ambulation it goes to 100-110s. He does compliant of dizziness and fatigue with ambulation. No syncope, Chest pain, dyspnea, lower extremity edema, orthopnea, PND or melena. Compliant with medication. During last cardioversion in 2017 he required 200 mg twice a day amiodarone for longer duration.  Past Medical History:  Diagnosis Date  . Atrial fibrillation ([place]) 01/05/2014   Rhythm control with amiodarone   . CAD (coronary artery disease) of artery bypass graft 01/05/2014   CABG,, 2001   . Chronic anticoagulation 01/05/2014   Coumadin   . Essential hypertension 01/05/2014  . Hyperlipidemia 01/05/2014    Past Surgical History:  Procedure Laterality Date  . CARDIOVERSION [name] 06/03/2016   Procedure: CARDIOVERSION;  Surgeon: Jerline Pain, MD;  Location: [facility] ENDOSCOPY;  Service: Cardiovascular;  Laterality: [name];  . CORONARY ARTERY BYPASS GRAFT    . TEE WITHOUT CARDIOVERSION [name] 06/03/2016   Procedure: TRANSESOPHAGEAL ECHOCARDIOGRAM (TEE);  Surgeon: Jerline Pain, MD;  Location: [facility] ENDOSCOPY;  Service: Cardiovascular;  Laterality: [name];    Current Medications: Prior to Admission medications   Medication Sig Start Date End Date Taking? Authorizing Provider  amiodarone (PACERONE) 200 MG tablet Take 1 tablet (200 mg total) by mouth daily. 04/30/15   Belva Crome, MD    amLODipine (NORVASC) 5 MG tablet Take 1 tablet by mouth daily. 12/07/13   [provider]  levothyroxine (SYNTHROID, LEVOTHROID) 200 MCG tablet Take 100 mcg by mouth daily before breakfast.    [provider]  metoprolol tartrate (LOPRESSOR) 25 MG tablet Take 1 tablet (25 mg total) by mouth 2 (two) times daily. 02/26/17 05/27/17  Belva Crome, MD  primidone (MYSOLINE) 50 MG tablet Take 50 mg by mouth daily.    [provider]  simvastatin (ZOCOR) 40 MG tablet Take 20 mg by mouth daily.    [provider]  warfarin (COUMADIN) 3 MG tablet Take 3 mg by mouth as directed. 10/12/15   [provider]    Allergies:   Penicillins and Shrimp [shellfish allergy]   Social History   Social History  . Marital status: Married    Spouse name: [name]  . Number of children: [name]  . Years of education: [name]   Social History Main Topics  . Smoking status: Former Research scientist (life sciences)  . Smokeless tobacco: Never Used  . Alcohol use No  . Drug use: No  . Sexual activity: Not Asked   Other Topics Concern  . None   Social History Narrative  . None     Family History:  The patient's family history includes Appendicitis in his sister; Arthritis in his father and mother; Cancer in his sister; Diabetes in his brother; Healthy in his sister; Heart disease in his brother, brother, father, mother, sister, and sister.  ROS:   Please see the history of present illness.  ROS All other systems reviewed and are negative.   PHYSICAL EXAM:   VS:  BP 118/74   Pulse 98   Ht 6\' 2"  (1.88 m)   Wt 180 lb (81.6 kg)   SpO2 96%   BMI 23.11 kg/m    GEN: Well nourished, well developed, in no acute distress  HEENT: normal  Neck: no JVD, carotid bruits, or masses Cardiac:RRR; no murmurs, rubs, or gallops,no edema  Respiratory:  clear to auscultation bilaterally, normal work of breathing GI: soft, nontender, nondistended, + BS MS: no deformity or atrophy  Skin: warm and dry, no  rash Neuro:  Alert and Oriented x 3, Strength and sensation are intact Psych: euthymic mood, full affect  Wt Readings from Last 3 Encounters:  03/12/17 180 lb (81.6 kg)  10/27/16 181 lb (82.1 kg)  06/03/16 181 lb (82.1 kg)      Studies/Labs Reviewed:   EKG:  EKG is ordered today.  The ekg ordered today demonstrates aflutter at rate of 98  Recent Labs: 05/30/2016: Hemoglobin 13.9; Potassium 4.6; Sodium 143   Lipid Panel No results found for: CHOL, TRIG, HDL, CHOLHDL, VLDL, LDLCALC, LDLDIRECT  Additional studies/ records that were reviewed today include:   TEE: 06/03/16 Study Conclusions  - Left ventricle: Systolic function was normal. The estimated   ejection fraction was in the range of 55% to 60%. - Aortic valve: No evidence of vegetation. There was mild   regurgitation. - Mitral valve: No evidence of vegetation. There was mild   regurgitation. - Left atrium: No evidence of thrombus in the atrial cavity or   appendage. - Right atrium: No evidence of thrombus in the atrial cavity or   appendage. - Atrial septum: No defect or patent foramen ovale was identified. - Tricuspid valve: No evidence of vegetation. - Pulmonic valve: No evidence of vegetation. - Superior vena cava: The study excluded a thrombus.   ASSESSMENT & PLAN:    1. Paroxysmal atrial fibrillation/flutter - Still in aflutter. Symptomatic with ambulation. Stop Norvasc. Increase metoprolol to 50mg  BID. Continue amiodarone 200mg  BID.  - His INR was 1.9 on 7/18 & 2.3 on 7/23. Will check again today in clinic. He will get INR at PCP office 8/9 & 8/16 (with EKG). If still in aflutter and therapeutic INR--> will schedule DCCV. If worsen symptoms, go to ER. Continue coumadin per PCP.  Reviewed with Dr. Marlou Porch (DOD).   2. CAD s/p CABG - No angina  3. HLD - followed by PCP. Continue statin   4. HTN - BP well controlled. Medication changes as above.     Medication Adjustments/Labs and Tests  Ordered: Current medicines are reviewed at length with the patient today.  Concerns regarding medicines are outlined above.  Medication changes, Labs and Tests ordered today are listed in the Patient Instructions below. Patient Instructions  Medication Instructions: Your physician has recommended you make the following change in your medication:     Labwork: TODAY:  PT/INR  Testing/Procedures: None ordered  Follow-Up: Your physician recommends that you schedule a follow-up appointment in:    Any Other Special Instructions Will Be Listed Below (If Applicable).     If you need a refill on your cardiac medications before your next appointment, please call your pharmacy.      Jarrett Soho, [place]  03/12/2017 12:14 PM    [facility] [address], [place], [place]  [address] Phone: [phone]; Fax: [phone]

## 2017-04-01 NOTE — Interval H&P Note (Signed)
History and Physical Interval Note:  04/01/2017 12:11 PM  Kurt Atkinson  has presented today for surgery, with the diagnosis of afib  The various methods of treatment have been discussed with the patient and family. After consideration of risks, benefits and other options for treatment, the patient has consented to  Procedure(s): CARDIOVERSION ([name]) as a surgical intervention .  The patient's history has been reviewed, patient examined, no change in status, stable for surgery.  I have reviewed the patient's chart and labs.  Questions were answered to the patient's satisfaction.     [name]

## 2017-04-01 NOTE — Anesthesia Procedure Notes (Signed)
Date/Time: 04/01/2017 1:07 PM Performed by: Willeen Cass P Pre-anesthesia Checklist: Patient identified, Emergency Drugs available, Suction available, Patient being monitored and Timeout performed Patient Re-evaluated:Patient Re-evaluated prior to induction Oxygen Delivery Method: Ambu bag Preoxygenation: Pre-oxygenation with 100% oxygen Induction Type: IV induction Ventilation: Mask ventilation without difficulty Dental Injury: Teeth and Oropharynx as per pre-operative assessment

## 2017-04-01 NOTE — Transfer of Care (Signed)
Immediate Anesthesia Transfer of Care Note  Patient: Kurt Atkinson  Procedure(s) Performed: Procedure(s): CARDIOVERSION ([name])  Patient Location: PACU and Endoscopy Unit  Anesthesia Type:General  Level of Consciousness: awake, drowsy and patient cooperative  Airway & Oxygen Therapy: Patient Spontanous Breathing and Patient connected to nasal cannula oxygen  Post-op Assessment: Report given to RN, Post -op Vital signs reviewed and stable and Patient moving all extremities  Post vital signs: Reviewed and stable  Last Vitals:  Vitals:   04/01/17 1142 04/01/17 1255  BP: (!) 154/94 (!) 162/87  Pulse: 92   Resp: 12 16  Temp: 36.4 C   SpO2: 98% 98%    Last Pain:  Vitals:   04/01/17 1142  TempSrc: Oral         Complications: No apparent anesthesia complications

## 2017-04-01 NOTE — CV Procedure (Signed)
    Cardioversion Note  Kurt Atkinson [id number] 08/14/1931  Procedure: DC Cardioversion Indications: atrial fibrillation  Procedure Details Consent: Obtained Time Out: Verified patient identification, verified procedure, site/side was marked, verified correct patient position, special equipment/implants available, Radiology Safety Procedures followed,  medications/allergies/relevent history reviewed, required imaging and test results available.  Performed  The patient has been on adequate anticoagulation.  The patient received IV propofol and Lidocain administered by anesthesia staff for sedation.  Synchronous cardioversion was performed at 120 joules.  The cardioversion was successful.   Complications: No apparent complications Patient did tolerate procedure well.   [name], MD, [facility] 04/01/2017, 1:11 PM

## 2017-04-02 ENCOUNTER — Encounter (HOSPITAL_COMMUNITY): Payer: Self-pay | Admitting: Cardiology

## 2017-04-02 NOTE — Anesthesia Postprocedure Evaluation (Signed)
Anesthesia Post Note  Patient: Kurt Atkinson  Procedure(s) Performed: Procedure(s) (LRB): CARDIOVERSION ([name])     Patient location during evaluation: PACU Anesthesia Type: General Level of consciousness: awake and alert and patient cooperative Pain management: pain level controlled Vital Signs Assessment: post-procedure vital signs reviewed and stable Respiratory status: spontaneous breathing and respiratory function stable Cardiovascular status: stable Anesthetic complications: no    Last Vitals:  Vitals:   04/01/17 1330 04/01/17 1340  BP: (!) 113/51 (!) 108/58  Pulse: (!) 44 (!) 46  Resp: 16 16  Temp:    SpO2: 97% 98%    Last Pain:  Vitals:   04/01/17 1316  TempSrc: Axillary                 Nini Cavan S

## 2017-04-29 ENCOUNTER — Ambulatory Visit (INDEPENDENT_AMBULATORY_CARE_PROVIDER_SITE_OTHER): Payer: Medicare Other | Admitting: Interventional Cardiology

## 2017-04-29 ENCOUNTER — Encounter: Payer: Self-pay | Admitting: Interventional Cardiology

## 2017-04-29 VITALS — BP 94/58 | HR 99 | Ht 74.5 in | Wt 177.2 lb

## 2017-04-29 DIAGNOSIS — I25709 Atherosclerosis of coronary artery bypass graft(s), unspecified, with unspecified angina pectoris: Secondary | ICD-10-CM | POA: Diagnosis not present

## 2017-04-29 DIAGNOSIS — E7849 Other hyperlipidemia: Secondary | ICD-10-CM

## 2017-04-29 DIAGNOSIS — Z9229 Personal history of other drug therapy: Secondary | ICD-10-CM

## 2017-04-29 DIAGNOSIS — Z7901 Long term (current) use of anticoagulants: Secondary | ICD-10-CM

## 2017-04-29 DIAGNOSIS — I4891 Unspecified atrial fibrillation: Secondary | ICD-10-CM

## 2017-04-29 DIAGNOSIS — I10 Essential (primary) hypertension: Secondary | ICD-10-CM | POA: Diagnosis not present

## 2017-04-29 DIAGNOSIS — E784 Other hyperlipidemia: Secondary | ICD-10-CM | POA: Diagnosis not present

## 2017-04-29 NOTE — Progress Notes (Signed)
Cardiology Office Note    Date:  04/29/2017   ID:  Kurt Atkinson, DOB 1932/08/05, MRN [id number]  PCP:  System, Pcp Not In  Cardiologist: Sinclair Grooms, MD   Chief Complaint  Patient presents with  . Atrial Fibrillation  . Coronary Artery Disease  . Medication Problem    History of Present Illness:  Kurt Atkinson is a 83 y.o. male with remote history of coronary bypass grafting 2001, paroxysmal atrial fibrillation with previous rhythm control on amiodarone but recent failure, chronic anticoagulation therapy with warfarin, essential hypertension, and hyperlipidemia.  Kurt Atkinson is generally quite active. He has ischemic heart disease and underwent successful coronary bypass grafting in 2001. He does not have angina pectoris. He has been troubled by atrial fibrillation for much of the past 15 years. When atrial fibrillation occurs he develops diastolic heart failure with exertional fatigue and dyspnea. He has had multiple cardioversions over the years. He has been maintained in sinus rhythm on amiodarone. Over the past 12 months however despite amiodarone therapy he has required electrical cardioversion. The most recent occasion was 04/01/2017 and prior to that on 06/03/2016.  Following the cardioversion, he felt stronger for proximally 7-10 days over the past week or so he has lost energy, has noted a gradual increase in heart rate, and at times has had relatively low blood pressure. In office today, EKG demonstrates atrial fibrillation with moderate rate control at 99 bpm. He takes amiodarone 200 mg daily. We have been using amlodipine and or metoprolol as needed for blood pressure and/or heart rate control prior to cardioversion. He is here with his daughter, who feels that he has probably been out of rhythm for several days to a week. She has been monitoring both blood pressure and heart rate.  According to the patient he clearly feels more energetic and let short of breath  when in sinus rhythm. He would prefer to maintain in rhythm if possible. TEE performed in October 2017 demonstrated "normal left atrial size" with EF of 55%.  Past Medical History:  Diagnosis Date  . Atrial fibrillation ([place]) 01/05/2014   Rhythm control with amiodarone   . CAD (coronary artery disease) of artery bypass graft 01/05/2014   CABG,, 2001   . Chronic anticoagulation 01/05/2014   Coumadin   . Essential hypertension 01/05/2014  . Hyperlipidemia 01/05/2014    Past Surgical History:  Procedure Laterality Date  . CARDIOVERSION [name] 06/03/2016   Procedure: CARDIOVERSION;  Surgeon: Jerline Pain, MD;  Location: [place];  Service: Cardiovascular;  Laterality: [name];  . CARDIOVERSION [name] 04/01/2017   Procedure: CARDIOVERSION;  Surgeon: Dorothy Spark, MD;  Location: [facility] ENDOSCOPY;  Service: Cardiovascular;  Laterality: [name];  . CORONARY ARTERY BYPASS GRAFT    . TEE WITHOUT CARDIOVERSION [name] 06/03/2016   Procedure: TRANSESOPHAGEAL ECHOCARDIOGRAM (TEE);  Surgeon: Jerline Pain, MD;  Location: [facility] ENDOSCOPY;  Service: Cardiovascular;  Laterality: [name];    Current Medications: Outpatient Medications Prior to Visit  Medication Sig Dispense Refill  . amiodarone (PACERONE) 200 MG tablet Take 1 tablet (200 mg total) by mouth daily. 90 tablet 1  . levothyroxine (SYNTHROID, LEVOTHROID) 200 MCG tablet Take 100 mcg by mouth daily before breakfast.    . primidone (MYSOLINE) 50 MG tablet Take 50 mg by mouth daily.    . simvastatin (ZOCOR) 40 MG tablet Take 20 mg by mouth daily.    . tamsulosin (FLOMAX) 0.4 MG CAPS capsule Take 1 capsule by mouth daily.    Marland Kitchen  warfarin (COUMADIN) 2.5 MG tablet Take 1 tablet by mouth daily.    . metoprolol tartrate (LOPRESSOR) 50 MG tablet Take 0.5 tablets (25 mg total) by mouth 2 (two) times daily. 180 tablet 3   Facility-Administered Medications Prior to Visit  Medication Dose Route Frequency Provider Last Rate Last Dose  . sodium chloride flush (NS) 0.9 %  injection 3 mL  3 mL Intravenous Q12H Belva Crome, MD      . sodium chloride flush (NS) 0.9 % injection 3 mL  3 mL Intravenous PRN Belva Crome, MD         Allergies:   Penicillins and Shrimp [shellfish allergy]   Social History   Social History  . Marital status: Married    Spouse name: [name]  . Number of children: [name]  . Years of education: [name]   Social History Main Topics  . Smoking status: Former Research scientist (life sciences)  . Smokeless tobacco: Never Used  . Alcohol use No  . Drug use: No  . Sexual activity: Not Asked   Other Topics Concern  . None   Social History Narrative  . None     Family History:  The patient's family history includes Appendicitis in his sister; Arthritis in his father and mother; Cancer in his sister; Diabetes in his brother; Healthy in his sister; Heart disease in his brother, brother, father, mother, sister, and sister.   ROS:   Please see the history of present illness.    Appetite is been stable. His daughter feels he is losing stamina gradually even prior to development of atrial fibrillation late this summer  All other systems reviewed and are negative.   PHYSICAL EXAM:   VS:  BP (!) 94/58 (BP Location: Left Arm)   Pulse 99   Ht 6' 2.5" (1.892 m)   Wt 177 lb 3.2 oz (80.4 kg)   BMI 22.45 kg/m    GEN: Well nourished, well developed, in no acute distress . HEENT: normal  Neck: no JVD, carotid bruits, or masses Cardiac: IIRR. No murmur or gallop. There is no edema. Respiratory:  clear to auscultation bilaterally, normal work of breathing GI: soft, nontender, nondistended, + BS MS: no deformity or atrophy  Skin: warm and dry, no rash Neuro:  Alert and Oriented x 3, Strength and sensation are intact Psych: euthymic mood, full affect  Wt Readings from Last 3 Encounters:  04/29/17 177 lb 3.2 oz (80.4 kg)  04/01/17 180 lb (81.6 kg)  03/12/17 180 lb (81.6 kg)      Studies/Labs Reviewed:   EKG:  EKG  Atrial fibrillation with moderate rate control.  PT interval slightly prolonged.  Recent Labs: 03/31/2017: BUN 34; Creatinine, Ser 1.97; Hemoglobin 13.6; Platelets 132; Potassium 4.6; Sodium 144   Lipid Panel No results found for: CHOL, TRIG, HDL, CHOLHDL, VLDL, LDLCALC, LDLDIRECT  Additional studies/ records that were reviewed today include:   Transesophageal echo 06/03/2016: Study Conclusions  - Left ventricle: Systolic function was normal. The estimated   ejection fraction was in the range of 55% to 60%. - Aortic valve: No evidence of vegetation. There was mild   regurgitation. - Mitral valve: No evidence of vegetation. There was mild   regurgitation. - Left atrium: No evidence of thrombus in the atrial cavity or   appendage. - Right atrium: No evidence of thrombus in the atrial cavity or   appendage. - Atrial septum: No defect or patent foramen ovale was identified. - Tricuspid valve: No evidence of vegetation. - Pulmonic  valve: No evidence of vegetation. - Superior vena cava: The study excluded a thrombus.   ASSESSMENT:    1. Atrial fibrillation, unspecified type ([place])   2. History of amiodarone therapy   3. Chronic anticoagulation   4. Essential hypertension   5. Other hyperlipidemia   6. Coronary artery disease involving coronary bypass graft of native heart with angina pectoris ([place])      PLAN:  In order of problems listed above:  1. After relatively intense reloading with amiodarone in a patient with Bentyl 100 mg per day for years, cardioversion was performed successfully I have her he has reverted to atrial fibrillation within 3 weeks of the procedure. He clearly has less energy and would prefer to attempt rhythm control. We discussed the lack of benefit from amiodarone at this time. We will however continue this medication as it is helping with rate control, referred to electrophysiology (Dr. Rayann Heman, who also sees his wife), and consider whether switching to dofetilide or consideration of ablation is  reasonable. 2. Continue amiodarone 200 mg per day until seen by EP 3. Continue Coumadin anticoagulation for stroke prophylaxis 4. Relatively low blood pressure today has led to discontinuation of amlodipine and metoprolol. We will closely monitor the blood pressure and heart rate given me feedback by my chart or with call in data. 5. Not addressed 6. Not addressed  Discontinue amlodipine, and metoprolol. Continue amiodarone 200 mg daily. He peak consultation with Dr. [name] for help with management of atrial fibrillation as the patient also to maintain sinus rhythm and according to a relatively recent TEE has a normal left atrial cavity size. I will see the patient back after Dr. Rayann Heman has completed evaluation and treatment.    Medication Adjustments/Labs and Tests Ordered: Current medicines are reviewed at length with the patient today.  Concerns regarding medicines are outlined above.  Medication changes, Labs and Tests ordered today are listed in the Patient Instructions below. Patient Instructions  Medication Instructions:  1) DISCONTINUE Metoprolol  Labwork: None  Testing/Procedures: None  Follow-Up: You have been referred to Dr. Rayann Heman in our Electrophysiology Department to help control Atrial Fibrillation and for consult on possible ablation.  Your physician wants you to follow-up in: 6 months with Dr. Tamala Julian.  You will receive a reminder letter in the mail two months in advance. If you don't receive a letter, please call our office to schedule the follow-up appointment.   Any Other Special Instructions Will Be Listed Below (If Applicable).   Dr. Tamala Julian would like for you to monitor blood pressure and call with these readings in the next 1-2 weeks.    If you need a refill on your cardiac medications before your next appointment, please call your pharmacy.      Signed, Sinclair Grooms, MD  04/29/2017 6:18 PM    [facility] [place], [place], [place]  [address] Phone: [phone]; Fax: [phone]

## 2017-04-29 NOTE — Patient Instructions (Addendum)
Medication Instructions:  1) DISCONTINUE Metoprolol  Labwork: None  Testing/Procedures: None  Follow-Up: You have been referred to Dr. Rayann Heman in our Electrophysiology Department to help control Atrial Fibrillation and for consult on possible ablation.  Your physician wants you to follow-up in: 6 months with Dr. Tamala Julian.  You will receive a reminder letter in the mail two months in advance. If you don't receive a letter, please call our office to schedule the follow-up appointment.   Any Other Special Instructions Will Be Listed Below (If Applicable).   Dr. Tamala Julian would like for you to monitor blood pressure and call with these readings in the next 1-2 weeks.    If you need a refill on your cardiac medications before your next appointment, please call your pharmacy.

## 2017-05-22 ENCOUNTER — Ambulatory Visit (INDEPENDENT_AMBULATORY_CARE_PROVIDER_SITE_OTHER): Payer: Medicare Other | Admitting: Internal Medicine

## 2017-05-22 ENCOUNTER — Encounter: Payer: Self-pay | Admitting: Internal Medicine

## 2017-05-22 VITALS — BP 102/60 | HR 110 | Ht 72.0 in | Wt 178.0 lb

## 2017-05-22 DIAGNOSIS — I25709 Atherosclerosis of coronary artery bypass graft(s), unspecified, with unspecified angina pectoris: Secondary | ICD-10-CM | POA: Diagnosis not present

## 2017-05-22 DIAGNOSIS — I10 Essential (primary) hypertension: Secondary | ICD-10-CM | POA: Diagnosis not present

## 2017-05-22 DIAGNOSIS — Z9229 Personal history of other drug therapy: Secondary | ICD-10-CM

## 2017-05-22 DIAGNOSIS — I4891 Unspecified atrial fibrillation: Secondary | ICD-10-CM

## 2017-05-22 MED ORDER — DILTIAZEM HCL ER COATED BEADS 120 MG PO CP24: 120.0000 mg | ORAL_CAPSULE | Freq: Every day | ORAL | 3 refills | Status: DC

## 2017-05-22 NOTE — Patient Instructions (Addendum)
Medication Instructions:  Your physician has recommended you make the following change in your medication: Start Diltiazem 120mg  daily   Labwork: None ordered   Testing/Procedures: None ordered   Follow-Up: Your physician recommends that you schedule a follow-up appointment in: 6 weeks with Dr. Rayann Heman.   Any Other Special Instructions Will Be Listed Below (If Applicable).     If you need a refill on your cardiac medications before your next appointment, please call your pharmacy.

## 2017-05-22 NOTE — Progress Notes (Signed)
Electrophysiology Office Note   Date:  05/22/2017   ID:  Kurt Atkinson, DOB [birth year], MRN [id number]   Cardiologist:  Dr Tamala Julian Primary Electrophysiologist: [name], MD    Chief Complaint  Patient presents with  . Atrial Fibrillation     History of Present Illness: Kurt Atkinson is a 83 y.o. male who presents today for electrophysiology evaluation.   He is referred by Dr Tamala Julian for EP consultation regarding his medicine refractory afib.  The patient reports initially having afib around the time of bypass surgery over 20 years ago.  He has been in amiodarone since that time.  He has had occasional episodes of afib.  More recently, his afib has increased substantially.  His amiodarone was increased and he underwent cardioversion 04/01/17.  He quickly returned to afib.  He has symptoms of worsening CHF with his afib.  Rate control has been difficult due to hypotension.  Currently, he feels reasonably well.  Today, he denies symptoms of palpitations, chest pain, shortness of breath, orthopnea, PND, lower extremity edema, claudication, dizziness, presyncope, syncope, bleeding, or neurologic sequela. The patient is tolerating medications without difficulties and is otherwise without complaint today.    Past Medical History:  Diagnosis Date  . CAD (coronary artery disease) of artery bypass graft 01/05/2014   CABG,, 2001   . Chronic anticoagulation 01/05/2014   Coumadin   . Essential hypertension 01/05/2014  . Hyperlipidemia 01/05/2014  . Persistent atrial fibrillation ([place]) 01/05/2014       Past Surgical History:  Procedure Laterality Date  . CARDIOVERSION [name] 06/03/2016   Procedure: CARDIOVERSION;  Surgeon: Jerline Pain, MD;  Location: [place];  Service: Cardiovascular;  Laterality: [name];  . CARDIOVERSION [name] 04/01/2017   Procedure: CARDIOVERSION;  Surgeon: Dorothy Spark, MD;  Location[facility] ENDOSCOPY;  Service: Cardiovascular;  Laterality: [name];  . CORONARY ARTERY  BYPASS GRAFT    . TEE WITHOUT CARDIOVERSION [name] 06/03/2016   Procedure: TRANSESOPHAGEAL ECHOCARDIOGRAM (TEE);  Surgeon: Jerline Pain, MD;  Location: [facility] ENDOSCOPY;  Service: Cardiovascular;  Laterality: [name];     Current Outpatient Prescriptions  Medication Sig Dispense Refill  . amiodarone (PACERONE) 200 MG tablet Take 1 tablet (200 mg total) by mouth daily. 90 tablet 1  . diltiazem (CARDIZEM CD) 120 MG 24 hr capsule Take 1 capsule (120 mg total) by mouth daily. 90 capsule 3  . levothyroxine (SYNTHROID, LEVOTHROID) 200 MCG tablet Take 100 mcg by mouth daily before breakfast.    . primidone (MYSOLINE) 50 MG tablet Take 50 mg by mouth daily.    . simvastatin (ZOCOR) 40 MG tablet Take 20 mg by mouth daily.    . tamsulosin (FLOMAX) 0.4 MG CAPS capsule Take 1 capsule by mouth daily.    Marland Kitchen warfarin (COUMADIN) 2.5 MG tablet Take 1 tablet by mouth daily.     Current Facility-Administered Medications  Medication Dose Route Frequency Provider Last Rate Last Dose  . sodium chloride flush (NS) 0.9 % injection 3 mL  3 mL Intravenous Q12H Belva Crome, MD      . sodium chloride flush (NS) 0.9 % injection 3 mL  3 mL Intravenous PRN Belva Crome, MD        Allergies:   Penicillins and Shrimp [shellfish allergy]   Social History:  The patient  reports that he has quit smoking. He has never used smokeless tobacco. He reports that he does not drink alcohol or use drugs.   Family History:  The patient's  family  history includes Appendicitis in his sister; Arthritis in his father and mother; Cancer in his sister; Diabetes in his brother; Healthy in his sister; Heart disease in his brother, brother, father, mother, sister, and sister.    ROS:  Please see the history of present illness.   All other systems are personally reviewed and negative.    PHYSICAL EXAM: VS:  BP 102/60   Pulse (!) 110   Ht 6' (1.829 m)   Wt 178 lb (80.7 kg)   BMI 24.14 kg/m  , BMI Body mass index is 24.14 kg/m. GEN: Well  nourished, well developed, in no acute distress  HEENT: normal  Neck: no JVD, carotid bruits, or masses Cardiac: iRRR; no murmurs, rubs, or gallops,no edema  Respiratory:  clear to auscultation bilaterally, normal work of breathing GI: soft, nontender, nondistended, + BS MS: no deformity or atrophy  Skin: warm and dry  Neuro:  Strength and sensation are intact Psych: euthymic mood, full affect  EKG:  EKG is ordered today. The ekg ordered today is personally reviewed and shows atypical atrial flutter,  V rate 100 bpm, nopnspecific ST/T changes   Recent Labs: 03/31/2017: BUN 34; Creatinine, Ser 1.97; Hemoglobin 13.6; Platelets 132; Potassium 4.6; Sodium 144  personally reviewed   Lipid Panel  No results found for: CHOL, TRIG, HDL, CHOLHDL, VLDL, LDLCALC, LDLDIRECT personally reviewed   Wt Readings from Last 3 Encounters:  05/22/17 178 lb (80.7 kg)  04/29/17 177 lb 3.2 oz (80.4 kg)  04/01/17 180 lb (81.6 kg)      Other studies personally reviewed: Additional studies/ records that were reviewed today include: Dr Darliss Ridgel notes, recent cardioversion, tee 06/04/16 Review of the above records today demonstrates: as above   ASSESSMENT AND PLAN:  1.  Persistent afib He has medicine refractory afib.  Therapeutic strategies for afib including rate and rhythm control were discussed in detail with the patient today. Risk, benefits, and alternatives to EP study and radiofrequency ablation for afib were also discussed in detail today. At this time, he is not interested in ablation and would prefer medical management.  I have started diltiazem 120mg  daily today.  He will return for further assessment in 6 weeks. Continue anticoagulation  2. HTN Stable No change required today  3. CAD Stable No change required today  Follow-up:  Return to see me in 6 weeks for further evaluation and rate control. If he decides to pursue ablation, he will contact my office.  Current medicines are  reviewed at length with the patient today.   The patient does not have concerns regarding his medicines.  The following changes were made today:  none  Labs/ tests ordered today include:  Orders Placed This Encounter  Procedures  . EKG 12-Lead     Signed, [name], MD  05/22/2017 10:34 AM     [facility] [address] [place] [address] [phone] (office) [phone] (fax)

## 2017-06-02 ENCOUNTER — Encounter: Payer: Self-pay | Admitting: Internal Medicine

## 2017-06-05 ENCOUNTER — Encounter: Payer: Self-pay | Admitting: Internal Medicine

## 2017-06-08 ENCOUNTER — Other Ambulatory Visit: Payer: Self-pay | Admitting: *Deleted

## 2017-06-08 MED ORDER — DILTIAZEM HCL ER COATED BEADS 180 MG PO CP24: 180.0000 mg | ORAL_CAPSULE | Freq: Every day | ORAL | 3 refills | Status: DC

## 2017-06-22 ENCOUNTER — Encounter: Payer: Self-pay | Admitting: Internal Medicine

## 2017-07-08 ENCOUNTER — Encounter: Payer: Self-pay | Admitting: Internal Medicine

## 2017-07-08 NOTE — Telephone Encounter (Signed)
Spoke with patient regarding his cardiazem change.  He was taking 180 mg daily, but now is taking 120 mg bid.  He states that his HR remains unchanged and needs some further advice.

## 2017-07-10 ENCOUNTER — Telehealth: Payer: Self-pay

## 2017-07-10 NOTE — Telephone Encounter (Signed)
Spoke with patient regarding his Cardizem dosage  concerns.  He states that he feeling better and will see Dr. Rayann Heman on 12/12 as scheduled.

## 2017-07-22 ENCOUNTER — Encounter: Payer: Self-pay | Admitting: Internal Medicine

## 2017-07-22 ENCOUNTER — Ambulatory Visit: Payer: Medicare Other | Admitting: Internal Medicine

## 2017-07-22 VITALS — BP 136/78 | HR 109 | Ht 72.0 in | Wt 181.2 lb

## 2017-07-22 DIAGNOSIS — I10 Essential (primary) hypertension: Secondary | ICD-10-CM

## 2017-07-22 DIAGNOSIS — I481 Persistent atrial fibrillation: Secondary | ICD-10-CM

## 2017-07-22 DIAGNOSIS — I480 Paroxysmal atrial fibrillation: Secondary | ICD-10-CM

## 2017-07-22 DIAGNOSIS — I4819 Other persistent atrial fibrillation: Secondary | ICD-10-CM

## 2017-07-22 DIAGNOSIS — I25810 Atherosclerosis of coronary artery bypass graft(s) without angina pectoris: Secondary | ICD-10-CM | POA: Diagnosis not present

## 2017-07-22 MED ORDER — DILTIAZEM HCL ER 120 MG PO CP12: ORAL_CAPSULE | ORAL | 11 refills | Status: DC

## 2017-07-22 NOTE — Progress Notes (Signed)
PCP: System, Pcp Not In Primary Cardiologist: Dr Tamala Julian Primary EP: Dr Rayann Heman  Kurt Atkinson is a 83 y.o. male who presents today for routine electrophysiology followup.  Since last being seen in our clinic, the patient reports doing reasonably well.  He continues to have elevated heart rates with fatigue and decreased exercise tolerance. Today, he denies symptoms of chest pain, shortness of breath,  lower extremity edema, dizziness, presyncope, or syncope.  The patient is otherwise without complaint today.   Past Medical History:  Diagnosis Date  . CAD (coronary artery disease) of artery bypass graft 01/05/2014   CABG,, 2001   . Chronic anticoagulation 01/05/2014   Coumadin   . Essential hypertension 01/05/2014  . Hyperlipidemia 01/05/2014  . Persistent atrial fibrillation ([place]) 01/05/2014       Past Surgical History:  Procedure Laterality Date  . CARDIOVERSION [name] 06/03/2016   Procedure: CARDIOVERSION;  Surgeon: Jerline Pain, MD;  Location: [place];  Service: Cardiovascular;  Laterality: [name];  . CARDIOVERSION [name] 04/01/2017   Procedure: CARDIOVERSION;  Surgeon: Dorothy Spark, MD;  Location: [facility] ENDOSCOPY;  Service: Cardiovascular;  Laterality: [name];  . CORONARY ARTERY BYPASS GRAFT    . TEE WITHOUT CARDIOVERSION [name] 06/03/2016   Procedure: TRANSESOPHAGEAL ECHOCARDIOGRAM (TEE);  Surgeon: Jerline Pain, MD;  Location: [facility] ENDOSCOPY;  Service: Cardiovascular;  Laterality: [name];    ROS- all systems are reviewed and negatives except as per HPI above  Current Outpatient Medications  Medication Sig Dispense Refill  . amiodarone (PACERONE) 200 MG tablet Take 1 tablet (200 mg total) by mouth daily. 90 tablet 1  . diltiazem (CARDIZEM SR) 120 MG 12 hr capsule Take 120 mg by mouth 2 (two) times daily.    Marland Kitchen levothyroxine (SYNTHROID, LEVOTHROID) 200 MCG tablet Take 100 mcg by mouth daily before breakfast.    . primidone (MYSOLINE) 50 MG tablet Take 50 mg by mouth daily.    .  simvastatin (ZOCOR) 40 MG tablet Take 20 mg by mouth daily.    . tamsulosin (FLOMAX) 0.4 MG CAPS capsule Take 1 capsule by mouth daily.    Marland Kitchen warfarin (COUMADIN) 2.5 MG tablet Take 1 tablet by mouth daily.     Current Facility-Administered Medications  Medication Dose Route Frequency Provider Last Rate Last Dose  . sodium chloride flush (NS) 0.9 % injection 3 mL  3 mL Intravenous Q12H Belva Crome, MD      . sodium chloride flush (NS) 0.9 % injection 3 mL  3 mL Intravenous PRN Belva Crome, MD        Physical Exam: Vitals:   07/22/17 1607  BP: 136/78  Pulse: (!) 109  SpO2: 97%  Weight: 181 lb 3.2 oz (82.2 kg)  Height: 6' (1.829 m)    GEN- The patient is well appearing, alert and oriented x 3 today.   Head- normocephalic, atraumatic Eyes-  Sclera clear, conjunctiva pink Ears- hearing intact Oropharynx- clear Lungs- Clear to ausculation bilaterally, normal work of breathing Heart- tachycardic irregular rhythm, no murmurs, rubs or gallops, PMI not laterally displaced GI- soft, NT, ND, + BS Extremities- no clubbing, cyanosis, or edema  EKG tracing ordered today is personally reviewed and shows atypical atrial flutter, V rates 110 bpm  Assessment and Plan:  1. Persistent afib/ atypical atrial flutter Therapeutic strategies for atrial arrhythmias including medicine and ablation were discussed in detail with the patient today. Risk, benefits, and alternatives to EP study and radiofrequency ablation for afib were also discussed in detail  today. These risks include but are not limited to stroke, bleeding, vascular damage, tamponade, perforation, damage to the esophagus, lungs, and other structures, pulmonary vein stenosis, worsening renal function, and death. The patient understands these risk and wishes to proceed.  We will therefore proceed with catheter ablation.  Continue coumadin with weekly INRs Will plan TEE prior to ablation Increase diltiazem today to 240mg  qam and 120mg   qpm.  2. CAD Stable No change required today  3. HTN Stable No change required today  [name] MD, [facility] 07/22/2017 4:21 PM

## 2017-07-22 NOTE — Patient Instructions (Addendum)
Medication Instructions:  Your physician has recommended you make the following change in your medication:  1) Increase Diltiazem to 240 mg every morning and 120 mg every night  .   Labwork: Your physician recommends that you return for lab work today: BMP/CBC/INR  Will need a INR next week pre ablation--have it faxed to [phone].  Done at PCP Dr. Montine Circle   Testing/Procedures: Your physician has requested that you have a TEE. During a TEE, sound waves are used to create images of your heart. It provides your doctor with information about the size and shape of your heart and how well your heart's chambers and valves are working. In this test, a transducer is attached to the end of a flexible tube that's guided down your throat and into your esophagus (the tube leading from you mouth to your stomach) to get a more detailed image of your heart. You are not awake for the procedure. Please see the instruction sheet given to you today. For further information please visit [email or web address]   Your physician has recommended that you have an ablation. Catheter ablation is a medical procedure used to treat some cardiac arrhythmias (irregular heartbeats). During catheter ablation, a long, thin, flexible tube is put into a blood vessel in your groin (upper thigh), or neck. This tube is called an ablation catheter. It is then guided to your heart through the blood vessel. Radio frequency waves destroy small areas of heart tissue where abnormal heartbeats may cause an arrhythmia to start. Please see the instruction sheet given to you today.--08/07/17  Please arrive at [facility] at 7:30am Do not eat or drink after midnight the night prior to the procedure Do not take any medications the morning of the test Plan for one night stay Will need someone to drive you home at discharge    Follow-Up: Your physician recommends that you schedule a  follow-up appointment in: 4 weeks from 08/07/17 with Roderic Palau, NP and 3 months from 07/18/17 with Dr Rayann Heman

## 2017-07-23 LAB — BASIC METABOLIC PANEL
BUN/Creatinine Ratio: 15 (ref 10–24)
BUN: 34 mg/dL — AB (ref 8–27)
CALCIUM: 8.5 mg/dL — AB (ref 8.6–10.2)
CHLORIDE: 112 mmol/L — AB (ref 96–106)
CO2: 21 mmol/L (ref 20–29)
Creatinine, Ser: 2.25 mg/dL — ABNORMAL HIGH (ref 0.76–1.27)
GFR calc non Af Amer: 26 mL/min/{1.73_m2} — ABNORMAL LOW (ref >59)
GFR, EST AFRICAN AMERICAN: 30 mL/min/{1.73_m2} — AB (ref >59)
GLUCOSE: 121 mg/dL — AB (ref 65–99)
POTASSIUM: 4.5 mmol/L (ref 3.5–5.2)
Sodium: 148 mmol/L — ABNORMAL HIGH (ref 134–144)

## 2017-07-23 LAB — CBC WITH DIFFERENTIAL/PLATELET
BASOS ABS: 0 10*3/uL (ref 0.0–0.2)
Basos: 0 %
EOS (ABSOLUTE): 0.1 10*3/uL (ref 0.0–0.4)
Eos: 1 %
HEMOGLOBIN: 13.1 g/dL (ref 13.0–17.7)
Hematocrit: 39.9 % (ref 37.5–51.0)
IMMATURE GRANS (ABS): 0 10*3/uL (ref 0.0–0.1)
Immature Granulocytes: 0 %
LYMPHS: 31 %
Lymphocytes Absolute: 2.8 10*3/uL (ref 0.7–3.1)
MCH: 30.1 pg (ref 26.6–33.0)
MCHC: 32.8 g/dL (ref 31.5–35.7)
MCV: 92 fL (ref 79–97)
MONOCYTES: 11 %
Monocytes Absolute: 1 10*3/uL — ABNORMAL HIGH (ref 0.1–0.9)
NEUTROS ABS: 5 10*3/uL (ref 1.4–7.0)
NEUTROS PCT: 57 %
PLATELETS: 138 10*3/uL — AB (ref 150–379)
RBC: 4.35 x10E6/uL (ref 4.14–5.80)
RDW: 14.4 % (ref 12.3–15.4)
WBC: 8.9 10*3/uL (ref 3.4–10.8)

## 2017-07-23 LAB — PROTIME-INR
INR: 1.7 — AB (ref 0.8–1.2)
PROTHROMBIN TIME: 16.7 s — AB (ref 9.1–12.0)

## 2017-07-24 ENCOUNTER — Telehealth: Payer: Self-pay | Admitting: Internal Medicine

## 2017-07-24 MED ORDER — DILTIAZEM HCL ER 120 MG PO CP12: ORAL_CAPSULE | ORAL | 3 refills | Status: DC

## 2017-07-24 NOTE — Telephone Encounter (Signed)
Per Denyse Amass, pharmacist at [facility], patient is requesting his diltiazem Rx be sent to [facility]. Rx was previously sent to [facility]. I reviewed the Rx and advised that I am sending #90 day supply and advised Denyse Amass to call back with questions or concerns. She thanked me for the call.

## 2017-07-24 NOTE — Telephone Encounter (Signed)
New Message   Pt c/o medication issue:  1. Name of Medication: Diltiazem  2. How are you currently taking this medication (dosage and times per day)? 120  3. Are you having a reaction (difficulty breathing--STAT)? no  4. What is your medication issue? Denyse Amass from the pharmacy needs new prescript for pt current dose.

## 2017-07-26 ENCOUNTER — Encounter: Payer: Self-pay | Admitting: Internal Medicine

## 2017-07-27 ENCOUNTER — Other Ambulatory Visit: Payer: Self-pay | Admitting: *Deleted

## 2017-07-27 DIAGNOSIS — R7989 Other specified abnormal findings of blood chemistry: Secondary | ICD-10-CM

## 2017-08-07 ENCOUNTER — Encounter (HOSPITAL_COMMUNITY): Payer: Self-pay

## 2017-08-07 ENCOUNTER — Ambulatory Visit (HOSPITAL_COMMUNITY): Admit: 2017-08-07 | Payer: Medicare Other | Admitting: Internal Medicine

## 2017-08-07 SURGERY — ECHOCARDIOGRAM, TRANSESOPHAGEAL
Anesthesia: Moderate Sedation

## 2017-08-07 SURGERY — ATRIAL FIBRILLATION ABLATION
Anesthesia: Monitor Anesthesia Care

## 2017-08-28 ENCOUNTER — Ambulatory Visit
Admission: RE | Admit: 2017-08-28 | Discharge: 2017-08-28 | Disposition: A | Discharge status: Home / Self Care | Payer: Medicare Other | Source: Ambulatory Visit | Attending: Nephrology | Admitting: Nephrology

## 2017-08-28 ENCOUNTER — Other Ambulatory Visit: Payer: Self-pay | Admitting: Nephrology

## 2017-08-28 DIAGNOSIS — N184 Chronic kidney disease, stage 4 (severe): Secondary | ICD-10-CM

## 2017-09-02 ENCOUNTER — Ambulatory Visit (HOSPITAL_COMMUNITY): Payer: Medicare Other | Admitting: Nurse Practitioner

## 2017-09-23 ENCOUNTER — Ambulatory Visit: Payer: Medicare Other | Admitting: Internal Medicine

## 2017-09-23 ENCOUNTER — Encounter: Payer: Self-pay | Admitting: *Deleted

## 2017-09-23 ENCOUNTER — Encounter: Payer: Self-pay | Admitting: Internal Medicine

## 2017-09-23 VITALS — BP 124/62 | HR 107 | Ht 72.0 in | Wt 172.0 lb

## 2017-09-23 DIAGNOSIS — N184 Chronic kidney disease, stage 4 (severe): Secondary | ICD-10-CM

## 2017-09-23 DIAGNOSIS — I481 Persistent atrial fibrillation: Secondary | ICD-10-CM

## 2017-09-23 DIAGNOSIS — I4819 Other persistent atrial fibrillation: Secondary | ICD-10-CM

## 2017-09-23 DIAGNOSIS — Z01812 Encounter for preprocedural laboratory examination: Secondary | ICD-10-CM | POA: Diagnosis not present

## 2017-09-23 DIAGNOSIS — I484 Atypical atrial flutter: Secondary | ICD-10-CM

## 2017-09-23 DIAGNOSIS — Z79899 Other long term (current) drug therapy: Secondary | ICD-10-CM

## 2017-09-23 NOTE — Progress Notes (Signed)
PCP: System, Pcp Not In Primary Cardiologist: Dr Tamala Julian Primary EP: Dr Rayann Heman  Kurt Atkinson is a 84 y.o. male who presents today for routine electrophysiology followup.  Since last being seen in our clinic, the patient reports doing reasonably well.  His afib ablation was cancelled due to worsening renal failure.  He remains in atypical atrial flutter.  + fatigue.  Today, he denies symptoms of palpitations, chest pain, shortness of breath,  lower extremity edema, dizziness, presyncope, or syncope.  The patient is otherwise without complaint today.   Past Medical History:  Diagnosis Date  . CAD (coronary artery disease) of artery bypass graft 01/05/2014   CABG,, 2001   . Chronic anticoagulation 01/05/2014   Coumadin   . Essential hypertension 01/05/2014  . Hyperlipidemia 01/05/2014  . Persistent atrial fibrillation ([place]) 01/05/2014       Past Surgical History:  Procedure Laterality Date  . CARDIOVERSION [name] 06/03/2016   Procedure: CARDIOVERSION;  Surgeon: Jerline Pain, MD;  Location: [place];  Service: Cardiovascular;  Laterality: [name];  . CARDIOVERSION [name] 04/01/2017   Procedure: CARDIOVERSION;  Surgeon: Dorothy Spark, MD;  Location: [facility] ENDOSCOPY;  Service: Cardiovascular;  Laterality: [name];  . CORONARY ARTERY BYPASS GRAFT    . TEE WITHOUT CARDIOVERSION [name] 06/03/2016   Procedure: TRANSESOPHAGEAL ECHOCARDIOGRAM (TEE);  Surgeon: Jerline Pain, MD;  Location: [facility] ENDOSCOPY;  Service: Cardiovascular;  Laterality: [name];    ROS- all systems are reviewed and negatives except as per HPI above  Current Outpatient Medications  Medication Sig Dispense Refill  . amiodarone (PACERONE) 200 MG tablet Take 1 tablet (200 mg total) by mouth daily. 90 tablet 1  . diltiazem (CARDIZEM SR) 120 MG 12 hr capsule Take 2 capsules by mouth in the morning and 1 capsule in the evening 270 capsule 3  . levothyroxine (SYNTHROID, LEVOTHROID) 200 MCG tablet Take 100 mcg by mouth daily before breakfast.     . primidone (MYSOLINE) 50 MG tablet Take 50 mg by mouth daily.    . simvastatin (ZOCOR) 40 MG tablet Take 20 mg by mouth daily.    . tamsulosin (FLOMAX) 0.4 MG CAPS capsule Take 1 capsule by mouth daily.    Marland Kitchen warfarin (COUMADIN) 2.5 MG tablet Take 1 tablet by mouth daily.     Current Facility-Administered Medications  Medication Dose Route Frequency Provider Last Rate Last Dose  . sodium chloride flush (NS) 0.9 % injection 3 mL  3 mL Intravenous Q12H Belva Crome, MD      . sodium chloride flush (NS) 0.9 % injection 3 mL  3 mL Intravenous PRN Belva Crome, MD        Physical Exam: Vitals:   09/23/17 1403  BP: 124/62  Pulse: (!) 107  Weight: 172 lb (78 kg)  Height: 6' (1.829 m)   GEN- The patient is well appearing, alert and oriented x 3 today.   Head- normocephalic, atraumatic Eyes-  Sclera clear, conjunctiva pink Ears- hearing intact Oropharynx- clear Lungs- Clear to ausculation bilaterally, normal work of breathing Heart- irregular rate and rhythm, no murmurs, rubs or gallops, PMI not laterally displaced GI- soft, NT, ND, + BS Extremities- no clubbing, cyanosis, or edema  EKG tracing ordered today is personally reviewed and shows typical atrial flutter,  V rate 107 bpm  Assessment and Plan:  1, persistent atrial fibrillation/ atypical atrial flutter Therapeutic strategies for afib/ atypical atrial flutter including medicine and ablation were discussed in detail with the patient today. Risk, benefits, and  alternatives to EP study and radiofrequency ablation for afib were also discussed in detail today. These risks include but are not limited to stroke, bleeding, vascular damage, tamponade, perforation, damage to the esophagus, lungs, and other structures, pulmonary vein stenosis, worsening renal function, and death. The patient understands these risk and wishes to proceed.  We will therefore proceed with catheter ablation at the next available time.  Carto, ICE, anesthesia  requested.  Wiill obtain TEE prior to the procedure.  Weekly INRs in the interim   2. Chronic renal failure Recently evaluated by nephrology Recently evaluated by Dr Marval Regal (note reviewed today) Stage IV rena failure  3. CAD Stable No change required today  4. HTN Stable No change required today   [name] MD, [facility] 09/23/2017 2:28 PM

## 2017-09-23 NOTE — H&P (View-Only) (Signed)
PCP: System, Pcp Not In Primary Cardiologist: Dr Tamala Julian Primary EP: Dr Rayann Heman  Kurt Atkinson is a 84 y.o. male who presents today for routine electrophysiology followup.  Since last being seen in our clinic, the patient reports doing reasonably well.  His afib ablation was cancelled due to worsening renal failure.  He remains in atypical atrial flutter.  + fatigue.  Today, he denies symptoms of palpitations, chest pain, shortness of breath,  lower extremity edema, dizziness, presyncope, or syncope.  The patient is otherwise without complaint today.   Past Medical History:  Diagnosis Date  . CAD (coronary artery disease) of artery bypass graft 01/05/2014   CABG,, 2001   . Chronic anticoagulation 01/05/2014   Coumadin   . Essential hypertension 01/05/2014  . Hyperlipidemia 01/05/2014  . Persistent atrial fibrillation ([place]) 01/05/2014       Past Surgical History:  Procedure Laterality Date  . CARDIOVERSION [name] 06/03/2016   Procedure: CARDIOVERSION;  Surgeon: Jerline Pain, MD;  Location: [place];  Service: Cardiovascular;  Laterality: [name];  . CARDIOVERSION [name] 04/01/2017   Procedure: CARDIOVERSION;  Surgeon: Dorothy Spark, MD;  Location[facility] ENDOSCOPY;  Service: Cardiovascular;  Laterality: [name];  . CORONARY ARTERY BYPASS GRAFT    . TEE WITHOUT CARDIOVERSION [name] 06/03/2016   Procedure: TRANSESOPHAGEAL ECHOCARDIOGRAM (TEE);  Surgeon: Jerline Pain, MD;  Location: [facility] ENDOSCOPY;  Service: Cardiovascular;  Laterality: [name];    ROS- all systems are reviewed and negatives except as per HPI above  Current Outpatient Medications  Medication Sig Dispense Refill  . amiodarone (PACERONE) 200 MG tablet Take 1 tablet (200 mg total) by mouth daily. 90 tablet 1  . diltiazem (CARDIZEM SR) 120 MG 12 hr capsule Take 2 capsules by mouth in the morning and 1 capsule in the evening 270 capsule 3  . levothyroxine (SYNTHROID, LEVOTHROID) 200 MCG tablet Take 100 mcg by mouth daily before breakfast.     . primidone (MYSOLINE) 50 MG tablet Take 50 mg by mouth daily.    . simvastatin (ZOCOR) 40 MG tablet Take 20 mg by mouth daily.    . tamsulosin (FLOMAX) 0.4 MG CAPS capsule Take 1 capsule by mouth daily.    Marland Kitchen warfarin (COUMADIN) 2.5 MG tablet Take 1 tablet by mouth daily.     Current Facility-Administered Medications  Medication Dose Route Frequency Provider Last Rate Last Dose  . sodium chloride flush (NS) 0.9 % injection 3 mL  3 mL Intravenous Q12H Belva Crome, MD      . sodium chloride flush (NS) 0.9 % injection 3 mL  3 mL Intravenous PRN Belva Crome, MD        Physical Exam: Vitals:   09/23/17 1403  BP: 124/62  Pulse: (!) 107  Weight: 172 lb (78 kg)  Height: 6' (1.829 m)   GEN- The patient is well appearing, alert and oriented x 3 today.   Head- normocephalic, atraumatic Eyes-  Sclera clear, conjunctiva pink Ears- hearing intact Oropharynx- clear Lungs- Clear to ausculation bilaterally, normal work of breathing Heart- irregular rate and rhythm, no murmurs, rubs or gallops, PMI not laterally displaced GI- soft, NT, ND, + BS Extremities- no clubbing, cyanosis, or edema  EKG tracing ordered today is personally reviewed and shows typical atrial flutter,  V rate 107 bpm  Assessment and Plan:  1, persistent atrial fibrillation/ atypical atrial flutter Therapeutic strategies for afib/ atypical atrial flutter including medicine and ablation were discussed in detail with the patient today. Risk, benefits, and  alternatives to EP study and radiofrequency ablation for afib were also discussed in detail today. These risks include but are not limited to stroke, bleeding, vascular damage, tamponade, perforation, damage to the esophagus, lungs, and other structures, pulmonary vein stenosis, worsening renal function, and death. The patient understands these risk and wishes to proceed.  We will therefore proceed with catheter ablation at the next available time.  Carto, ICE, anesthesia  requested.  Wiill obtain TEE prior to the procedure.  Weekly INRs in the interim   2. Chronic renal failure Recently evaluated by nephrology Recently evaluated by Dr Marval Regal (note reviewed today) Stage IV rena failure  3. CAD Stable No change required today  4. HTN Stable No change required today   [name] MD, [facility] 09/23/2017 2:28 PM

## 2017-09-23 NOTE — Patient Instructions (Addendum)
Medication Instructions:  Your physician recommends that you continue on your current medications as directed. Please refer to the Current Medication list given to you today.  * If you need a refill on your cardiac medications before your next appointment, please call your pharmacy. *  Labwork: Your physician recommends that you return for lab work on 10/12/17 for BMET, CBC w/ diff & INR  Testing/Procedures: Your physician has requested that you have a TEE the morning of your ablation. During a TEE, sound waves are used to create images of your heart. It provides your doctor with information about the size and shape of your heart and how well your heart's chambers and valves are working. In this test, a transducer is attached to the end of a flexible tube that's guided down your throat and into your esophagus (the tube leading from you mouth to your stomach) to get a more detailed image of your heart. You are not awake for the procedure. Please see the instruction sheet given to you today. For further information please visit [email or web address].  Your physician has recommended that you have an ablation. Catheter ablation is a medical procedure used to treat some cardiac arrhythmias (irregular heartbeats). During catheter ablation, a long, thin, flexible tube is put into a blood vessel in your groin (upper thigh), or neck. This tube is called an ablation catheter. It is then guided to your heart through the blood vessel. Radio frequency waves destroy small areas of heart tissue where abnormal heartbeats may cause an arrhythmia to start. Please see the instruction sheet given to you today.  Follow-Up: Your physician recommends that you schedule a follow-up appointment in: 4 weeks, after your procedure on 10/13/17, with Roderic Palau in the AFib clinic.  Your physician recommends that you schedule a follow-up appointment in: 3 months, after your procedure on 10/13/17, with Dr. Rayann Heman.  Thank you for  choosing [facility]!!   Trinidad Curet, RN [phone]  Any Other Special Instructions Will Be Listed Below (If Applicable).   Transesophageal Echocardiogram Transesophageal echocardiography (TEE) is a picture test of your heart using sound waves. The pictures taken can give very detailed pictures of your heart. This can help your doctor see if there are problems with your heart. TEE can check:  If your heart has blood clots in it.  How well your heart valves are working.  If you have an infection on the inside of your heart.  Some of the major arteries of your heart.  If your heart valve is working after a Office [employer].  Your heart before a procedure that uses a shock to your heart to get the rhythm back to normal.  What happens before the procedure?  Do not eat or drink for 6 hours before the procedure or as told by your doctor.  Make plans to have someone drive you home after the procedure. Do not drive yourself home.  An IV tube will be put in your arm. What happens during the procedure?  You will be given a medicine to help you relax (sedative). It will be given through the IV tube.  A numbing medicine will be sprayed or gargled in the back of your throat to help numb it.  The tip of the probe is placed into the back of your mouth. You will be asked to swallow. This helps to pass the probe into your esophagus.  Once the tip of the probe is in the right place, your doctor can take pictures of your  heart.  You may feel pressure at the back of your throat. What happens after the procedure?  You will be taken to a recovery area so the sedative can wear off.  Your throat may be sore and scratchy. This will go away slowly over time.  You will go home when you are fully awake and able to swallow liquids.  You should have someone stay with you for the next 24 hours.  Do not drive or operate machinery for the next 24 hours. This information is not intended to replace  advice given to you by your health care provider. Make sure you discuss any questions you have with your health care provider. Document Released: 05/25/2009 Document Revised: 01/03/2016 Document Reviewed: 01/27/2013 Elsevier Interactive Patient Education  2018 [employer].   Cardiac Ablation Cardiac ablation is a procedure to disable (ablate) a small amount of heart tissue in very specific places. The heart has many electrical connections. Sometimes these connections are abnormal and can cause the heart to beat very fast or irregularly. Ablating some of the problem areas can improve the heart rhythm or return it to normal. Ablation may be done for people who:  Have Wolff-Parkinson-White syndrome.  Have fast heart rhythms (tachycardia).  Have taken medicines for an abnormal heart rhythm (arrhythmia) that were not effective or caused side effects.  Have a high-risk heartbeat that may be life-threatening.  During the procedure, a small incision is made in the neck or the groin, and a long, thin, flexible tube (catheter) is inserted into the incision and moved to the heart. Small devices (electrodes) on the tip of the catheter will send out electrical currents. A type of X-ray (fluoroscopy) will be used to help guide the catheter and to provide images of the heart. Tell a health care provider about:  Any allergies you have.  All medicines you are taking, including vitamins, herbs, eye drops, creams, and over-the-counter medicines.  Any problems you or family members have had with anesthetic medicines.  Any blood disorders you have.  Any surgeries you have had.  Any medical conditions you have, such as kidney failure.  Whether you are pregnant or may be pregnant. What are the risks? Generally, this is a safe procedure. However, problems may occur, including:  Infection.  Bruising and bleeding at the catheter insertion site.  Bleeding into the chest, especially into the sac that  surrounds the heart. This is a serious complication.  Stroke or blood clots.  Damage to other structures or organs.  Allergic reaction to medicines or dyes.  Need for a permanent pacemaker if the normal electrical system is damaged. A pacemaker is a small computer that sends electrical signals to the heart and helps your heart beat normally.  The procedure not being fully effective. This may not be recognized until months later. Repeat ablation procedures are sometimes required.  What happens before the procedure?  Follow instructions from your health care provider about eating or drinking restrictions.  Ask your health care provider about: ? Changing or stopping your regular medicines. This is especially important if you are taking diabetes medicines or blood thinners. ? Taking medicines such as aspirin and ibuprofen. These medicines can thin your blood. Do not take these medicines before your procedure if your health care provider instructs you not to.  Plan to have someone take you home from the hospital or clinic.  If you will be going home right after the procedure, plan to have someone with you for 24 hours.  What happens during the procedure?  To lower your risk of infection: ? Your health care team will wash or sanitize their hands. ? Your skin will be washed with soap. ? Hair may be removed from the incision area.  An IV tube will be inserted into one of your veins.  You will be given a medicine to help you relax (sedative).  The skin on your neck or groin will be numbed.  An incision will be made in your neck or your groin.  A needle will be inserted through the incision and into a large vein in your neck or groin.  A catheter will be inserted into the needle and moved to your heart.  Dye may be injected through the catheter to help your surgeon see the area of the heart that needs treatment.  Electrical currents will be sent from the catheter to ablate heart  tissue in desired areas. There are three types of energy that may be used to ablate heart tissue: ? Heat (radiofrequency energy). ? Laser energy. ? Extreme cold (cryoablation).  When the necessary tissue has been ablated, the catheter will be removed.  Pressure will be held on the catheter insertion area to prevent excessive bleeding.  A bandage (dressing) will be placed over the catheter insertion area. The procedure may vary among health care providers and hospitals. What happens after the procedure?  Your blood pressure, heart rate, breathing rate, and blood oxygen level will be monitored until the medicines you were given have worn off.  Your catheter insertion area will be monitored for bleeding. You will need to lie still for a few hours to ensure that you do not bleed from the catheter insertion area.  Do not drive for 24 hours or as long as directed by your health care provider. Summary  Cardiac ablation is a procedure to disable (ablate) a small amount of heart tissue in very specific places. Ablating some of the problem areas can improve the heart rhythm or return it to normal.  During the procedure, electrical currents will be sent from the catheter to ablate heart tissue in desired areas. This information is not intended to replace advice given to you by your health care provider. Make sure you discuss any questions you have with your health care provider. Document Released: 12/14/2008 Document Revised: 06/16/2016 Document Reviewed: 06/16/2016 Elsevier Interactive Patient Education  [employer].

## 2017-09-24 LAB — PROTIME-INR
INR: 2.7 — ABNORMAL HIGH (ref 0.8–1.2)
PROTHROMBIN TIME: 26.6 s — AB (ref 9.1–12.0)

## 2017-09-25 NOTE — Addendum Note (Signed)
Addended by: Marlis Edelson C on: 09/25/2017 10:15 AM   Modules accepted: Orders

## 2017-09-28 LAB — PROTIME-INR: INR: 2.8 — AB (ref 0.9–1.1)

## 2017-10-06 LAB — PROTIME-INR: INR: 3.1 — AB (ref 0.9–1.1)

## 2017-10-12 ENCOUNTER — Other Ambulatory Visit: Payer: Medicare Other | Admitting: *Deleted

## 2017-10-12 DIAGNOSIS — Z01812 Encounter for preprocedural laboratory examination: Secondary | ICD-10-CM

## 2017-10-12 DIAGNOSIS — I4819 Other persistent atrial fibrillation: Secondary | ICD-10-CM

## 2017-10-12 LAB — CBC WITH DIFFERENTIAL/PLATELET
Basophils Absolute: 0 10*3/uL (ref 0.0–0.2)
Basos: 0 %
EOS (ABSOLUTE): 0.1 10*3/uL (ref 0.0–0.4)
EOS: 1 %
HEMATOCRIT: 41.5 % (ref 37.5–51.0)
Hemoglobin: 13.9 g/dL (ref 13.0–17.7)
IMMATURE GRANULOCYTES: 0 %
Immature Grans (Abs): 0 10*3/uL (ref 0.0–0.1)
LYMPHS ABS: 2.3 10*3/uL (ref 0.7–3.1)
Lymphs: 22 %
MCH: 30.2 pg (ref 26.6–33.0)
MCHC: 33.5 g/dL (ref 31.5–35.7)
MCV: 90 fL (ref 79–97)
MONOS ABS: 1 10*3/uL — AB (ref 0.1–0.9)
Monocytes: 10 %
NEUTROS PCT: 67 %
Neutrophils Absolute: 6.7 10*3/uL (ref 1.4–7.0)
PLATELETS: 139 10*3/uL — AB (ref 150–379)
RBC: 4.61 x10E6/uL (ref 4.14–5.80)
RDW: 14.3 % (ref 12.3–15.4)
WBC: 10.1 10*3/uL (ref 3.4–10.8)

## 2017-10-12 LAB — BASIC METABOLIC PANEL
BUN / CREAT RATIO: 15 (ref 10–24)
BUN: 31 mg/dL — ABNORMAL HIGH (ref 8–27)
CO2: 21 mmol/L (ref 20–29)
Calcium: 8.9 mg/dL (ref 8.6–10.2)
Chloride: 108 mmol/L — ABNORMAL HIGH (ref 96–106)
Creatinine, Ser: 2.02 mg/dL — ABNORMAL HIGH (ref 0.76–1.27)
GFR calc Af Amer: 34 mL/min/{1.73_m2} — ABNORMAL LOW (ref >59)
GFR calc non Af Amer: 30 mL/min/{1.73_m2} — ABNORMAL LOW (ref >59)
GLUCOSE: 101 mg/dL — AB (ref 65–99)
POTASSIUM: 4.3 mmol/L (ref 3.5–5.2)
SODIUM: 144 mmol/L (ref 134–144)

## 2017-10-12 LAB — PROTIME-INR
INR: 1.8 — ABNORMAL HIGH (ref 0.8–1.2)
Prothrombin Time: 18 s — ABNORMAL HIGH (ref 9.1–12.0)

## 2017-10-13 ENCOUNTER — Encounter (HOSPITAL_COMMUNITY)
Admission: RE | Disposition: A | Discharge status: Home / Self Care | Payer: Self-pay | Source: Ambulatory Visit | Attending: Internal Medicine

## 2017-10-13 ENCOUNTER — Ambulatory Visit (HOSPITAL_COMMUNITY)
Admission: RE | Admit: 2017-10-13 | Discharge: 2017-10-14 | Disposition: A | Discharge status: Home / Self Care | Payer: Medicare Other | Source: Ambulatory Visit | Attending: Internal Medicine | Admitting: Internal Medicine

## 2017-10-13 ENCOUNTER — Encounter (HOSPITAL_COMMUNITY): Payer: Self-pay | Admitting: *Deleted

## 2017-10-13 ENCOUNTER — Ambulatory Visit (HOSPITAL_COMMUNITY): Payer: Medicare Other | Admitting: Certified Registered"

## 2017-10-13 ENCOUNTER — Ambulatory Visit (HOSPITAL_BASED_OUTPATIENT_CLINIC_OR_DEPARTMENT_OTHER)
Admission: RE | Admit: 2017-10-13 | Discharge: 2017-10-13 | Disposition: A | Discharge status: Home / Self Care | Payer: Medicare Other | Source: Ambulatory Visit | Attending: Nurse Practitioner | Admitting: Nurse Practitioner

## 2017-10-13 ENCOUNTER — Other Ambulatory Visit: Payer: Self-pay

## 2017-10-13 DIAGNOSIS — I481 Persistent atrial fibrillation: Secondary | ICD-10-CM | POA: Diagnosis not present

## 2017-10-13 DIAGNOSIS — Z7901 Long term (current) use of anticoagulants: Secondary | ICD-10-CM | POA: Insufficient documentation

## 2017-10-13 DIAGNOSIS — N189 Chronic kidney disease, unspecified: Secondary | ICD-10-CM | POA: Insufficient documentation

## 2017-10-13 DIAGNOSIS — Z880 Allergy status to penicillin: Secondary | ICD-10-CM | POA: Diagnosis not present

## 2017-10-13 DIAGNOSIS — I484 Atypical atrial flutter: Secondary | ICD-10-CM | POA: Insufficient documentation

## 2017-10-13 DIAGNOSIS — Z87891 Personal history of nicotine dependence: Secondary | ICD-10-CM | POA: Diagnosis not present

## 2017-10-13 DIAGNOSIS — I483 Typical atrial flutter: Secondary | ICD-10-CM | POA: Diagnosis not present

## 2017-10-13 DIAGNOSIS — I129 Hypertensive chronic kidney disease with stage 1 through stage 4 chronic kidney disease, or unspecified chronic kidney disease: Secondary | ICD-10-CM | POA: Insufficient documentation

## 2017-10-13 DIAGNOSIS — E785 Hyperlipidemia, unspecified: Secondary | ICD-10-CM | POA: Diagnosis not present

## 2017-10-13 DIAGNOSIS — I25810 Atherosclerosis of coronary artery bypass graft(s) without angina pectoris: Secondary | ICD-10-CM | POA: Insufficient documentation

## 2017-10-13 DIAGNOSIS — I340 Nonrheumatic mitral (valve) insufficiency: Secondary | ICD-10-CM

## 2017-10-13 DIAGNOSIS — I4819 Other persistent atrial fibrillation: Secondary | ICD-10-CM | POA: Diagnosis present

## 2017-10-13 HISTORY — PX: TEE WITHOUT CARDIOVERSION: SHX5443

## 2017-10-13 HISTORY — DX: Chronic kidney disease, stage 4 (severe): N18.4

## 2017-10-13 HISTORY — DX: Hypothyroidism, unspecified: E03.9

## 2017-10-13 HISTORY — PX: ATRIAL FIBRILLATION ABLATION: EP1191

## 2017-10-13 LAB — PROTIME-INR
INR: 1.98
Prothrombin Time: 22.4 seconds — ABNORMAL HIGH (ref 11.4–15.2)

## 2017-10-13 LAB — POCT ACTIVATED CLOTTING TIME
Activated Clotting Time: 257 seconds
Activated Clotting Time: 285 seconds
Activated Clotting Time: 356 seconds
Activated Clotting Time: 345 seconds
Activated Clotting Time: 224 seconds
Activated Clotting Time: 235 seconds
Activated Clotting Time: 202 seconds
Activated Clotting Time: 197 seconds

## 2017-10-13 SURGERY — ATRIAL FIBRILLATION ABLATION
Anesthesia: General

## 2017-10-13 SURGERY — ECHOCARDIOGRAM, TRANSESOPHAGEAL
Anesthesia: Moderate Sedation

## 2017-10-13 MED ORDER — BUPIVACAINE HCL (PF) 0.25 % IJ SOLN
INTRAMUSCULAR | Status: AC
  Filled 2017-10-13: qty 30

## 2017-10-13 MED ORDER — LIDOCAINE HCL (CARDIAC) 20 MG/ML IV SOLN
INTRAVENOUS | Status: DC | PRN
  Administered 2017-10-13: 40 mg via INTRAVENOUS

## 2017-10-13 MED ORDER — ONDANSETRON HCL 4 MG/2ML IJ SOLN: 4.0000 mg | Freq: Once | INTRAMUSCULAR | Status: DC | PRN

## 2017-10-13 MED ORDER — BUTAMBEN-TETRACAINE-BENZOCAINE 2-2-14 % EX AERO
INHALATION_SPRAY | CUTANEOUS | Status: DC | PRN
  Administered 2017-10-13: 2 via TOPICAL

## 2017-10-13 MED ORDER — MIDAZOLAM HCL 5 MG/ML IJ SOLN
INTRAMUSCULAR | Status: AC
  Filled 2017-10-13: qty 2

## 2017-10-13 MED ORDER — FENTANYL CITRATE (PF) 100 MCG/2ML IJ SOLN
INTRAMUSCULAR | Status: DC | PRN
  Administered 2017-10-13: 25 ug via INTRAVENOUS

## 2017-10-13 MED ORDER — PHENYLEPHRINE HCL 10 MG/ML IJ SOLN
INTRAVENOUS | Status: DC | PRN
  Administered 2017-10-13: 20 ug/min via INTRAVENOUS

## 2017-10-13 MED ORDER — SODIUM CHLORIDE 0.9 % IV SOLN
INTRAVENOUS | Status: DC | PRN
  Administered 2017-10-13 (×2): via INTRAVENOUS

## 2017-10-13 MED ORDER — MIDAZOLAM HCL 10 MG/2ML IJ SOLN
INTRAMUSCULAR | Status: DC | PRN
  Administered 2017-10-13 (×2): 2 mg via INTRAVENOUS

## 2017-10-13 MED ORDER — ONDANSETRON HCL 4 MG/2ML IJ SOLN
INTRAMUSCULAR | Status: DC | PRN
  Administered 2017-10-13: 4 mg via INTRAVENOUS

## 2017-10-13 MED ORDER — HEPARIN SODIUM (PORCINE) 1000 UNIT/ML IJ SOLN
INTRAMUSCULAR | Status: DC | PRN
  Administered 2017-10-13: 4000 [IU] via INTRAVENOUS
  Administered 2017-10-13: 10000 [IU] via INTRAVENOUS
  Administered 2017-10-13: 5000 [IU] via INTRAVENOUS

## 2017-10-13 MED ORDER — FLUMAZENIL 0.5 MG/5ML IV SOLN
INTRAVENOUS | Status: DC | PRN
  Administered 2017-10-13: .4 mg via INTRAVENOUS

## 2017-10-13 MED ORDER — HYDROCODONE-ACETAMINOPHEN 5-325 MG PO TABS: 1.0000 | ORAL_TABLET | ORAL | Status: DC | PRN

## 2017-10-13 MED ORDER — LEVOTHYROXINE SODIUM 100 MCG PO TABS: 100.0000 ug | ORAL_TABLET | Freq: Every day | ORAL | Status: DC

## 2017-10-13 MED ORDER — HEPARIN SODIUM (PORCINE) 1000 UNIT/ML IJ SOLN
INTRAMUSCULAR | Status: AC
  Filled 2017-10-13: qty 1

## 2017-10-13 MED ORDER — SODIUM CHLORIDE 0.9 % IV SOLN
INTRAVENOUS | Status: AC | PRN
  Administered 2017-10-13: 500 mL via INTRAMUSCULAR

## 2017-10-13 MED ORDER — FLUMAZENIL 1 MG/10ML IV SOLN
INTRAVENOUS | Status: AC
  Filled 2017-10-13: qty 10

## 2017-10-13 MED ORDER — SODIUM CHLORIDE 0.9 % IV SOLN: INTRAVENOUS | Status: DC

## 2017-10-13 MED ORDER — SODIUM CHLORIDE 0.9 % IV SOLN: 250.0000 mL | INTRAVENOUS | Status: DC | PRN

## 2017-10-13 MED ORDER — BUPIVACAINE HCL (PF) 0.25 % IJ SOLN
INTRAMUSCULAR | Status: DC | PRN
  Administered 2017-10-13: 25 mL

## 2017-10-13 MED ORDER — SODIUM CHLORIDE 0.9% FLUSH: 3.0000 mL | Freq: Two times a day (BID) | INTRAVENOUS | Status: DC

## 2017-10-13 MED ORDER — WARFARIN SODIUM 2.5 MG PO TABS
2.5000 mg | ORAL_TABLET | Freq: Every evening | ORAL | Status: DC
  Administered 2017-10-13: 21:00:00 2.5 mg via ORAL
  Filled 2017-10-13: qty 1

## 2017-10-13 MED ORDER — IOPAMIDOL (ISOVUE-370) INJECTION 76%
INTRAVENOUS | Status: AC
  Filled 2017-10-13: qty 50

## 2017-10-13 MED ORDER — FENTANYL CITRATE (PF) 100 MCG/2ML IJ SOLN: 25.0000 ug | INTRAMUSCULAR | Status: DC | PRN

## 2017-10-13 MED ORDER — PROPOFOL 10 MG/ML IV BOLUS
INTRAVENOUS | Status: DC | PRN
  Administered 2017-10-13: 110 mg via INTRAVENOUS

## 2017-10-13 MED ORDER — PROTAMINE SULFATE 10 MG/ML IV SOLN
INTRAVENOUS | Status: DC | PRN
  Administered 2017-10-13: 5 mg via INTRAVENOUS
  Administered 2017-10-13: 10 mg via INTRAVENOUS

## 2017-10-13 MED ORDER — ACETAMINOPHEN 325 MG PO TABS: 650.0000 mg | ORAL_TABLET | ORAL | Status: DC | PRN

## 2017-10-13 MED ORDER — SODIUM CHLORIDE 0.9 % IV SOLN
INTRAVENOUS | Status: DC
  Administered 2017-10-13: 09:00:00 via INTRAVENOUS

## 2017-10-13 MED ORDER — PRIMIDONE 50 MG PO TABS
50.0000 mg | ORAL_TABLET | Freq: Every day | ORAL | Status: DC
Start: 2017-10-14 — End: 2017-10-14
  Filled 2017-10-13: qty 1

## 2017-10-13 MED ORDER — SODIUM CHLORIDE 0.9% FLUSH: 3.0000 mL | INTRAVENOUS | Status: DC | PRN

## 2017-10-13 MED ORDER — ONDANSETRON HCL 4 MG/2ML IJ SOLN: 4.0000 mg | Freq: Four times a day (QID) | INTRAMUSCULAR | Status: DC | PRN

## 2017-10-13 MED ORDER — TAMSULOSIN HCL 0.4 MG PO CAPS
0.4000 mg | ORAL_CAPSULE | Freq: Every day | ORAL | Status: DC
  Administered 2017-10-13: 21:00:00 0.4 mg via ORAL
  Filled 2017-10-13: qty 1

## 2017-10-13 MED ORDER — OFF THE BEAT BOOK
Freq: Once | Status: DC
  Filled 2017-10-13: qty 1

## 2017-10-13 MED ORDER — WARFARIN - PHYSICIAN DOSING INPATIENT: Freq: Every day | Status: DC

## 2017-10-13 MED ORDER — PHENYLEPHRINE 40 MCG/ML (10ML) SYRINGE FOR IV PUSH (FOR BLOOD PRESSURE SUPPORT)
PREFILLED_SYRINGE | INTRAVENOUS | Status: DC | PRN
  Administered 2017-10-13 (×3): 80 ug via INTRAVENOUS

## 2017-10-13 MED ORDER — FENTANYL CITRATE (PF) 100 MCG/2ML IJ SOLN
INTRAMUSCULAR | Status: AC
  Filled 2017-10-13: qty 2

## 2017-10-13 SURGICAL SUPPLY — 17 items
BLANKET WARM UNDERBOD FULL ACC (MISCELLANEOUS) ×2 IMPLANT
CATH MAPPNG PENTARAY F 2-6-2MM (CATHETERS) ×1 IMPLANT
CATH NAVISTAR SMARTTOUCH DF (ABLATOR) ×2 IMPLANT
CATH SOUNDSTAR 3D IMAGING (CATHETERS) ×2 IMPLANT
CATH WEBSTER BI DIR CS D-F CRV (CATHETERS) ×2 IMPLANT
COVER SWIFTLINK CONNECTOR (BAG) ×2 IMPLANT
NEEDLE TRANSEP BRK 71CM 407200 (NEEDLE) ×2 IMPLANT
PACK EP LATEX FREE (CUSTOM PROCEDURE TRAY) ×1
PACK EP LF (CUSTOM PROCEDURE TRAY) ×1 IMPLANT
PAD DEFIB LIFELINK (PAD) ×2 IMPLANT
PATCH CARTO3 (PAD) ×2 IMPLANT
PENTARAY F 2-6-2MM (CATHETERS) ×2
SHEATH AVANTI 11F 11CM (SHEATH) ×2 IMPLANT
SHEATH PINNACLE 7F 10CM (SHEATH) ×4 IMPLANT
SHEATH PINNACLE 9F 10CM (SHEATH) ×2 IMPLANT
SHEATH SWARTZ TS SL2 63CM 8.5F (SHEATH) ×2 IMPLANT
TUBING SMART ABLATE COOLFLOW (TUBING) ×2 IMPLANT

## 2017-10-13 NOTE — Discharge Instructions (Signed)
Post procedure activity/care instructions No driving for 4 days. No lifting over 5 lbs for 1 week. No vigorous activity for 1 week.Marland Kitchen Keep procedure site clean & dry. If you notice increased pain, swelling, bleeding or pus, call/return!  You may shower, but no soaking baths/hot tubs/pools for 1 week.   You have an appointment set up with the [facility].  Multiple studies have shown that being followed by a dedicated atrial fibrillation clinic in addition to the standard care you receive from your other physicians improves health. We believe that enrollment in the atrial fibrillation clinic will allow [place] to better care for you.   The phone number to the [facility] is [phone]. The clinic is staffed Monday through Friday from 8:30am to 5pm.  Parking Directions: The clinic is located in the Heart and Vascular Building connected to [facility]. 1)From [address] turn on to [address] and go to the 3rd entrance  (Heart and Vascular entrance) on the right. 2)Look to the right for Heart &Vascular Parking Garage. 3)A code for the entrance is required please call the clinic to receive this.   4)Take the elevators to the 1st floor. Registration is in the room with the glass walls at the end of the hallway.  If you have any trouble parking or locating the clinic, please dont hesitate to call [phone].

## 2017-10-13 NOTE — Interval H&P Note (Signed)
History and Physical Interval Note:  10/13/2017 10:28 AM  Kurt Atkinson  has presented today for surgery, with the diagnosis of afib  The various methods of treatment have been discussed with the patient and family. After consideration of risks, benefits and other options for treatment, the patient has consented to  Procedure(s): ATRIAL FIBRILLATION ABLATION ([name]) as a surgical intervention .  The patient's history has been reviewed, patient examined, no change in status, stable for surgery.  I have reviewed the patient's chart and labs.  Questions were answered to the patient's satisfaction.     [name]

## 2017-10-13 NOTE — CV Procedure (Signed)
     Transesophageal Echocardiogram Note  Kurt Atkinson [id number] [birth year]  Procedure: Transesophageal Echocardiogram Indications: atrial fibrillation  Procedure Details Consent: Obtained Time Out: Verified patient identification, verified procedure, site/side was marked, verified correct patient position, special equipment/implants available, Radiology Safety Procedures followed,  medications/allergies/relevent history reviewed, required imaging and test results available.  Performed  Medications: During this procedure the patient is administered a total of Versed 4 mg and Fentanyl 25 mcg to achieve and maintain moderate conscious sedation.  The patient's heart rate, blood pressure, and oxygen saturation are monitored continuously during the procedure. The period of conscious sedation is 30 minutes, of which I was present face-to-face 100% of this time.  - Left ventricle: Systolic function was mildly reduced. The   estimated ejection fraction was in the range of 45% to 50%.   Diffuse hypokinesis. - Aortic valve: There was moderate regurgitation directed centrally   in the LVOT. - Mitral valve: Mitral valve leaflets are thickened with bowing of   the anterior leaflet. There are three jets of mitral   regurgitation contributing to a severe mitral regurgitation.   There is systolic forward flow reversal in the left sided   pulmonary veins. - Left atrium: The atrium was dilated. No evidence of thrombus in   the atrial cavity or appendage. - Right atrium: No evidence of thrombus in the atrial cavity or   appendage. - Atrial septum: No defect or patent foramen ovale was identified. - Tricuspid valve: No evidence of vegetation.  Impressions:  - No cardiac source of emboli was indentified. LVEF 45-50% with   diffuse hypokinesis. Severe mitral regurgitation and moderate   aortic regurgitation.   A repeat TTE post atrial fibrillation ablation is recommended to   reevaluate  LVEF and degree of mitral regurgitation. If no   improvement the patient is a potential mitral clip candidate.  Complications: No apparent complications Patient did tolerate procedure well.  [name], MD, [facility] 10/13/2017, 11:16 AM

## 2017-10-13 NOTE — Anesthesia Procedure Notes (Signed)
Procedure Name: LMA Insertion Date/Time: 10/13/2017 11:32 AM Performed by: Barrington Ellison, CRNA Pre-anesthesia Checklist: Patient identified, Emergency Drugs available, Suction available and Patient being monitored Patient Re-evaluated:Patient Re-evaluated prior to induction Oxygen Delivery Method: Circle System Utilized Preoxygenation: Pre-oxygenation with 100% oxygen Induction Type: IV induction Ventilation: Mask ventilation without difficulty LMA: LMA inserted LMA Size: 4.0 Number of attempts: 2 Placement Confirmation: positive ETCO2 Tube secured with: Tape Dental Injury: Teeth and Oropharynx as per pre-operative assessment  Comments: 1st attempt with LMA 5- unable to insert, 2nd attempt LMA 4-ventilates easily

## 2017-10-13 NOTE — Interval H&P Note (Signed)
History and Physical Interval Note:  10/13/2017 10:12 AM  Kurt Atkinson  has presented today for surgery, with the diagnosis of AFIB  The various methods of treatment have been discussed with the patient and family. After consideration of risks, benefits and other options for treatment, the patient has consented to  Procedure(s): TRANSESOPHAGEAL ECHOCARDIOGRAM (TEE) ([name]) as a surgical intervention .  The patient's history has been reviewed, patient examined, no change in status, stable for surgery.  I have reviewed the patient's chart and labs.  Questions were answered to the patient's satisfaction.     [name]

## 2017-10-13 NOTE — Transfer of Care (Signed)
Immediate Anesthesia Transfer of Care Note  Patient: Kurt Atkinson  Procedure(s) Performed: ATRIAL FIBRILLATION ABLATION ([name] )  Patient Location: Cath Lab  Anesthesia Type:General  Level of Consciousness: awake, alert  and oriented  Airway & Oxygen Therapy: Patient Spontanous Breathing  Post-op Assessment: Report given to RN  Post vital signs: Reviewed and stable  Last Vitals:  Vitals:   10/13/17 1059 10/13/17 1100  BP: (!) 98/47   Pulse: (!) 102   Resp: 18   Temp:    SpO2: 97% 96%    Last Pain:  Vitals:   10/13/17 0900  TempSrc: Oral         Complications: No apparent anesthesia complications

## 2017-10-13 NOTE — Progress Notes (Addendum)
Site area: RFV x 3 Site Prior to Removal:  Level 0 Pressure Applied For:25 min Manual:   yes Patient Status During Pull:   Post Pull Site:  Level 0 Post Pull Instructions Given:  yes Post Pull Pulses Present: palpable Dressing Applied:  tegaderm Bedrest begins @ 5465 till 2330 Comments:

## 2017-10-13 NOTE — Anesthesia Preprocedure Evaluation (Addendum)
Anesthesia Evaluation  Patient identified by MRN, date of birth, ID band Patient awake    Reviewed: Allergy & Precautions, NPO status , Patient's Chart, lab work & pertinent test results  Airway Mallampati: II  TM Distance: >3 FB Neck ROM: Full    Dental  (+) Dental Advisory Given, Partial Upper, Partial Lower, Chipped,    Pulmonary former smoker,    breath sounds clear to auscultation       Cardiovascular hypertension,  Rhythm:Irregular Rate:Normal     Neuro/Psych    GI/Hepatic   Endo/Other    Renal/GU      Musculoskeletal   Abdominal   Peds  Hematology   Anesthesia Other Findings   Reproductive/Obstetrics                            Anesthesia Physical Anesthesia Plan  ASA: III  Anesthesia Plan: General   Post-op Pain Management:    Induction: Intravenous  PONV Risk Score and Plan: Ondansetron and Dexamethasone  Airway Management Planned: Natural Airway  Additional Equipment:   Intra-op Plan:   Post-operative Plan:   Informed Consent: I have reviewed the patients History and Physical, chart, labs and discussed the procedure including the risks, benefits and alternatives for the proposed anesthesia with the patient or authorized representative who has indicated his/her understanding and acceptance.   Dental advisory given  Plan Discussed with: CRNA and Anesthesiologist  Anesthesia Plan Comments:         Anesthesia Quick Evaluation

## 2017-10-13 NOTE — Progress Notes (Signed)
Patient asked if he had any pain in his chest, reported burning pain in mid chest. Stated it was not bad. Explained that this is normal after this procedure and he would be started on medication tomorrow for it. Patient verbalized understanding

## 2017-10-13 NOTE — Progress Notes (Signed)
Pt S/P A-fib RFA in NSR with occ short runs of A-fib noted.

## 2017-10-13 NOTE — Progress Notes (Signed)
  Echocardiogram Echocardiogram Transesophageal has been performed.  Kurt Atkinson 10/13/2017, 6:02 PM

## 2017-10-13 NOTE — Interval H&P Note (Signed)
History and Physical Interval Note:  10/13/2017 8:34 AM  Kurt Atkinson  has presented today for surgery, with the diagnosis of AFIB  The various methods of treatment have been discussed with the patient and family. After consideration of risks, benefits and other options for treatment, the patient has consented to  Procedure(s): TRANSESOPHAGEAL ECHOCARDIOGRAM (TEE) ([name]) as a surgical intervention .  The patient's history has been reviewed, patient examined, no change in status, stable for surgery.  I have reviewed the patient's chart and labs.  Questions were answered to the patient's satisfaction.     [name]

## 2017-10-13 NOTE — Discharge Summary (Addendum)
ELECTROPHYSIOLOGY PROCEDURE DISCHARGE SUMMARY    Patient ID: Kurt Atkinson,  MRN: [id number], DOB/AGE: 1932-03-25 84 y.o.  Admit date: 10/13/2017 Discharge date: 10/14/17  Primary Care Physician: System, Pcp Not In  Primary Cardiologist: Dr. Tamala Julian Electrophysiologist: [name], MD  Primary Discharge Diagnosis:  1. Persistent AFib 2. Atypical AFlutter     CHA2DS2Vasc is 4, on warfarin, monitored and managed with his PMD  Secondary Discharge Diagnosis:  1. CRI (IV) 2. HTN  3. CAD  Procedures This Admission:  1.  Electrophysiology study and radiofrequency catheter ablation on 10/13/17 by Dr [name].   This study demonstrated    CONCLUSIONS: 1. Left atrial flutter upon presentation successfully ablated at the anterolateral base of the left atrial appendage.   2. Intracardiac echo reveals a large sized left atrium with four separate pulmonary veins without evidence of pulmonary vein stenosis.  Atrial voltage mapping confirms dense and severe atriopathy 3. Successful electrical isolation and anatomical encircling of all four pulmonary veins with radiofrequency current.    4. Counter clockwise right atrial flutter successfully ablated along the Cavo-tricuspid isthmus with complete bidirectional isthmus block achieved.  5. Two additional left atrial flutter circuits were observed but were too unstable for mapping and ablation today  6. No early apparent complications.  Brief HPI: Kurt Atkinson is a 84 y.o. male with a history of persistent atrial fibrillation.  He has failed medical therapy with amiodarone. Risks, benefits, and alternatives to catheter ablation of atrial fibrillation were reviewed with the patient who wished to proceed.  The patient underwent TEE prior to the procedure which demonstrated LVEF 45-50%, severe MR, mod AI no LAA thrombus.    Hospital Course:  The patient was admitted and underwent EPS/RFCA of atrial fibrillation with details as outlined  above.  He was monitored on telemetry overnight which demonstrated SR, brief AT.  R groin was without complication on the day of discharge.  The patient was examined by Dr. Rayann Heman and considered to be stable for discharge.  Wound care and restrictions were reviewed with the patient.  The patient will be seen back by Roderic Palau, NP in 4 weeks and Dr Rayann Heman in 12 weeks for post ablation follow up.    Physical Exam: Vitals:   10/13/17 2000 10/13/17 2100 10/14/17 0304 10/14/17 0800  BP: (!) 129/50 (!) 112/41 (!) 117/50   Pulse: (!) 57 (!) 56 61 66  Resp: 14 14 14    Temp:  98.3 F (36.8 C) 98.3 F (36.8 C) (!) 97.5 F (36.4 C)  TempSrc:  Oral Oral Oral  SpO2: 96% 94% 95%   Weight:   173 lb 1 oz (78.5 kg)   Height:        GEN- The patient is well appearing, alert and oriented x 3 today.   HEENT: normocephalic, atraumatic; sclera clear, conjunctiva pink; hearing intact; oropharynx clear; neck supple  Lungs- CTA b/l, normal work of breathing.  No wheezes, rales, rhonchi Heart- RRR, 1/6 SM, no rubs or gallops  GI- soft, non-tender, non-distended  Extremities- no clubbing, cyanosis, or edema; R DP/PT pulses 2+,  R groin without hematoma/bruit MS- no significant deformity, age appropriate atrophy Skin- warm and dry, no rash or lesion Psych- euthymic mood, full affect Neuro- strength and sensation are intact   Labs:   Lab Results  Component Value Date   WBC 10.1 10/12/2017   HGB 13.9 10/12/2017   HCT 41.5 10/12/2017   MCV 90 10/12/2017   PLT 139 (L) 10/12/2017  Recent Labs  Lab 10/12/17 1231  NA 144  K 4.3  CL 108*  CO2 21  BUN 31*  CREATININE 2.02*  CALCIUM 8.9  GLUCOSE 101*     Discharge Medications:  Allergies as of 10/14/2017      Reactions   Penicillins Itching, Rash, Other (See Comments)   Has patient had a PCN reaction causing immediate rash, facial/tongue/throat swelling, SOB or lightheadedness with hypotension: Yes Has patient had a PCN reaction causing  severe rash involving mucus membranes or skin necrosis: No Has patient had a PCN reaction that required hospitalization: No Has patient had a PCN reaction occurring within the last 10 years: No If all of the above answers are "NO", then may proceed with Cephalosporin use.   Shrimp [shellfish Allergy] Diarrhea, Nausea And Vomiting      Medication List    TAKE these medications   amiodarone 200 MG tablet Commonly known as:  PACERONE Take 1 tablet (200 mg total) by mouth daily.   diltiazem 120 MG 12 hr capsule Commonly known as:  CARDIZEM SR Take 1 capsule (120 mg total) by mouth daily. What changed:    how much to take  how to take this  when to take this  additional instructions   levothyroxine 200 MCG tablet Commonly known as:  SYNTHROID, LEVOTHROID Take 100 mcg by mouth daily before breakfast.   pantoprazole 40 MG tablet Commonly known as:  PROTONIX Take 1 tablet (40 mg total) by mouth daily.   primidone 50 MG tablet Commonly known as:  MYSOLINE Take 50 mg by mouth daily.   simvastatin 40 MG tablet Commonly known as:  ZOCOR Take 20 mg by mouth daily.   tamsulosin 0.4 MG Caps capsule Commonly known as:  FLOMAX Take 0.4 mg by mouth daily.   VITAMIN B-12 PO Take 1 tablet by mouth daily.   warfarin 2.5 MG tablet Commonly known as:  COUMADIN Take 2.5 mg by mouth every evening. Notes to patient:  Take 2.5mg  daily for 2 days then 2mg  on third day, then 2.5mg  for 2 days, then 2mg .... Keep this regime until next lab/level check and instructions       Disposition:  Home  Discharge Instructions    Diet - low sodium heart healthy   Complete by:  As directed    Increase activity slowly   Complete by:  As directed      Follow-up Information    [facility] Follow up on 11/13/2017.   Specialty:  Cardiology Why:  10:00AM Contact information: [address] [id number] [place] [phone]        [name], MD Follow up on 12/30/2017.   Specialty:  Cardiology Why:  2:00PM Contact information: [place] [address] [phone]        Dr. Montine Circle. Schedule an appointment as soon as possible for a visit.   Why:  Call for an appointment to have coumadin check done by Monday 10/19/17 Contact information: Prmary care doctor          Duration of Discharge Encounter: Greater than 30 minutes including physician time.  Signed, Tommye Standard, PA-C 10/14/2017 9:19 AM  I have seen, examined the patient, and reviewed the above assessment and plan.  Changes to above are made where necessary.  Doing well s/p ablation.  DC to home with routine discharge/outpatient management.  Co Sign: [name], MD 10/14/2017 11:07 AM

## 2017-10-14 ENCOUNTER — Encounter (HOSPITAL_COMMUNITY): Payer: Self-pay | Admitting: Internal Medicine

## 2017-10-14 DIAGNOSIS — I481 Persistent atrial fibrillation: Secondary | ICD-10-CM

## 2017-10-14 DIAGNOSIS — I25810 Atherosclerosis of coronary artery bypass graft(s) without angina pectoris: Secondary | ICD-10-CM | POA: Diagnosis not present

## 2017-10-14 DIAGNOSIS — I483 Typical atrial flutter: Secondary | ICD-10-CM | POA: Diagnosis not present

## 2017-10-14 DIAGNOSIS — I484 Atypical atrial flutter: Secondary | ICD-10-CM | POA: Diagnosis not present

## 2017-10-14 LAB — PROTIME-INR
INR: 2.08
PROTHROMBIN TIME: 23.2 s — AB (ref 11.4–15.2)

## 2017-10-14 MED ORDER — DILTIAZEM HCL ER 120 MG PO CP12: 120.0000 mg | ORAL_CAPSULE | Freq: Every day | ORAL | 11 refills | Status: DC

## 2017-10-14 MED ORDER — PANTOPRAZOLE SODIUM 40 MG PO TBEC: 40.0000 mg | DELAYED_RELEASE_TABLET | Freq: Every day | ORAL | 0 refills | Status: DC

## 2017-10-14 NOTE — Anesthesia Postprocedure Evaluation (Signed)
Anesthesia Post Note  Patient: Kurt Atkinson  Procedure(s) Performed: ATRIAL FIBRILLATION ABLATION ([name] )     Patient location during evaluation: Nursing Unit Anesthesia Type: General Level of consciousness: awake and alert, oriented and awake Pain management: pain level controlled Vital Signs Assessment: post-procedure vital signs reviewed and stable Respiratory status: spontaneous breathing, nonlabored ventilation and respiratory function stable Cardiovascular status: blood pressure returned to baseline Anesthetic complications: no    Last Vitals:  Vitals:   10/14/17 0304 10/14/17 0800  BP: (!) 117/50 (!) 123/59  Pulse: 61 66  Resp: 14 18  Temp: 36.8 C (!) 36.4 C  SpO2: 95%     Last Pain:  Vitals:   10/14/17 0800  TempSrc: Oral  PainSc:                  Luzelena Heeg COKER

## 2017-10-14 NOTE — Plan of Care (Signed)
  Progressing Education: Knowledge of General Education information will improve 10/14/2017 0019 - Progressing by Randal Buba, RN Cardiac: Ability to achieve and maintain adequate cardiopulmonary perfusion will improve 10/14/2017 0019 - Progressing by Randal Buba, RN Pain Managment: General experience of comfort will improve 10/14/2017 0019 - Progressing by Randal Buba, RN

## 2017-11-05 ENCOUNTER — Ambulatory Visit: Payer: Medicare Other | Admitting: Internal Medicine

## 2017-11-12 ENCOUNTER — Ambulatory Visit (HOSPITAL_COMMUNITY): Payer: Medicare Other | Admitting: Nurse Practitioner

## 2017-11-18 ENCOUNTER — Ambulatory Visit (HOSPITAL_COMMUNITY)
Admission: RE | Admit: 2017-11-18 | Discharge: 2017-11-18 | Disposition: A | Discharge status: Home / Self Care | Payer: Medicare Other | Source: Ambulatory Visit | Attending: Nurse Practitioner | Admitting: Nurse Practitioner

## 2017-11-18 ENCOUNTER — Encounter (HOSPITAL_COMMUNITY): Payer: Self-pay | Admitting: Nurse Practitioner

## 2017-11-18 VITALS — BP 138/56 | HR 58 | Ht 72.0 in | Wt 160.2 lb

## 2017-11-18 DIAGNOSIS — N184 Chronic kidney disease, stage 4 (severe): Secondary | ICD-10-CM | POA: Diagnosis not present

## 2017-11-18 DIAGNOSIS — E039 Hypothyroidism, unspecified: Secondary | ICD-10-CM | POA: Insufficient documentation

## 2017-11-18 DIAGNOSIS — I480 Paroxysmal atrial fibrillation: Secondary | ICD-10-CM

## 2017-11-18 DIAGNOSIS — Z79890 Hormone replacement therapy (postmenopausal): Secondary | ICD-10-CM | POA: Diagnosis not present

## 2017-11-18 DIAGNOSIS — I25810 Atherosclerosis of coronary artery bypass graft(s) without angina pectoris: Secondary | ICD-10-CM | POA: Insufficient documentation

## 2017-11-18 DIAGNOSIS — R001 Bradycardia, unspecified: Secondary | ICD-10-CM | POA: Diagnosis not present

## 2017-11-18 DIAGNOSIS — E785 Hyperlipidemia, unspecified: Secondary | ICD-10-CM | POA: Insufficient documentation

## 2017-11-18 DIAGNOSIS — Z96651 Presence of right artificial knee joint: Secondary | ICD-10-CM | POA: Diagnosis not present

## 2017-11-18 DIAGNOSIS — Z880 Allergy status to penicillin: Secondary | ICD-10-CM | POA: Diagnosis not present

## 2017-11-18 DIAGNOSIS — Z7901 Long term (current) use of anticoagulants: Secondary | ICD-10-CM | POA: Diagnosis not present

## 2017-11-18 DIAGNOSIS — I440 Atrioventricular block, first degree: Secondary | ICD-10-CM | POA: Diagnosis not present

## 2017-11-18 DIAGNOSIS — Z87891 Personal history of nicotine dependence: Secondary | ICD-10-CM | POA: Diagnosis not present

## 2017-11-18 DIAGNOSIS — I481 Persistent atrial fibrillation: Secondary | ICD-10-CM | POA: Diagnosis not present

## 2017-11-18 DIAGNOSIS — Z9889 Other specified postprocedural states: Secondary | ICD-10-CM | POA: Insufficient documentation

## 2017-11-18 DIAGNOSIS — I129 Hypertensive chronic kidney disease with stage 1 through stage 4 chronic kidney disease, or unspecified chronic kidney disease: Secondary | ICD-10-CM | POA: Insufficient documentation

## 2017-11-18 DIAGNOSIS — Z79899 Other long term (current) drug therapy: Secondary | ICD-10-CM | POA: Diagnosis not present

## 2017-11-18 DIAGNOSIS — I4891 Unspecified atrial fibrillation: Secondary | ICD-10-CM | POA: Diagnosis present

## 2017-11-18 NOTE — Progress Notes (Signed)
Primary Care Physician: System, Pcp Not In Referring Physician: Dr. Lerry Paterson Kurt Atkinson is a 84 y.o. male with a h/o CAD, HTN, HLD, paroxysmal afib that is in the Afib clinic today for f/u ablation performed 10/14/17.He reports that he has been in SR as far as he knows. Continues on amiodarone and  Warfarin.Marland Kitchen He feels well. No swallowing or groin issues. TEE prior to ablation showed reduced EF at 45% and severe MR. It was recommended that a TTE be repeated after the ablation with pt in SR and this will be scheduled today.   Today, he denies symptoms of palpitations, chest pain, shortness of breath, orthopnea, PND, lower extremity edema, dizziness, presyncope, syncope, or neurologic sequela. The patient is tolerating medications without difficulties and is otherwise without complaint today.   Past Medical History:  Diagnosis Date  . CAD (coronary artery disease) of artery bypass graft 01/05/2014   CABG,, 2001   . Chronic anticoagulation 01/05/2014   Coumadin   . CKD (chronic kidney disease), stage IV ([place])   . Essential hypertension 01/05/2014  . Hyperlipidemia 01/05/2014  . Hypothyroidism   . Persistent atrial fibrillation ([place]) 01/05/2014       Past Surgical History:  Procedure Laterality Date  . ATRIAL FIBRILLATION ABLATION  10/13/2017  . ATRIAL FIBRILLATION ABLATION [name] 10/13/2017   Procedure: ATRIAL FIBRILLATION ABLATION;  Surgeon: [name], MD;  Location: [place] [facility];  Service: Cardiovascular;  Laterality: [name];  . CARDIAC CATHETERIZATION  2001  . CARDIOVERSION [name] 06/03/2016   Procedure: CARDIOVERSION;  Surgeon: Jerline Pain, MD;  Location: [place];  Service: Cardiovascular;  Laterality: [name];  . CARDIOVERSION [name] 04/01/2017   Procedure: CARDIOVERSION;  Surgeon: Dorothy Spark, MD;  Location: [facility] ENDOSCOPY;  Service: Cardiovascular;  Laterality: [name];  . CORONARY ARTERY BYPASS GRAFT  2001   "CABG X4"  . JOINT REPLACEMENT    . TEE WITHOUT CARDIOVERSION [name]  06/03/2016   Procedure: TRANSESOPHAGEAL ECHOCARDIOGRAM (TEE);  Surgeon: Jerline Pain, MD;  Location: [place];  Service: Cardiovascular;  Laterality: [name];  . TEE WITHOUT CARDIOVERSION [name] 10/13/2017   Procedure: TRANSESOPHAGEAL ECHOCARDIOGRAM (TEE);  Surgeon: Dorothy Spark, MD;  [facility] END[facility]COPY;  Service: Cardiovascular;  Laterality: [name];  . TOTAL KNEE ARTHROPLASTY Right     Current Outpatient Medications  Medication Sig Dispense Refill  . amiodarone (PACERONE) 200 MG tablet Take 1 tablet (200 mg total) by mouth daily. 90 tablet 1  . Cyanocobalamin (VITAMIN B-12 PO) Take 1 tablet by mouth daily.    Marland Kitchen diltiazem (CARDIZEM SR) 120 MG 12 hr capsule Take 1 capsule (120 mg total) by mouth daily. 30 capsule 11  . levothyroxine (SYNTHROID, LEVOTHROID) 200 MCG tablet Take 100 mcg by mouth daily before breakfast.    . pantoprazole (PROTONIX) 40 MG tablet Take 1 tablet (40 mg total) by mouth daily. 45 tablet 0  . primidone (MYSOLINE) 50 MG tablet Take 50 mg by mouth daily.    . simvastatin (ZOCOR) 40 MG tablet Take 20 mg by mouth daily.    Marland Kitchen warfarin (COUMADIN) 2.5 MG tablet Take 2.5 mg by mouth every evening.     . tamsulosin (FLOMAX) 0.4 MG CAPS capsule Take 0.4 mg by mouth daily.      Current Facility-Administered Medications  Medication Dose Route Frequency Provider Last Rate Last Dose  . sodium chloride flush (NS) 0.9 % injection 3 mL  3 mL Intravenous Q12H Belva Crome, MD      . sodium  chloride flush (NS) 0.9 % injection 3 mL  3 mL Intravenous PRN Belva Crome, MD        Allergies  Allergen Reactions  . Penicillins Itching, Rash and Other (See Comments)    Has patient had a PCN reaction causing immediate rash, facial/tongue/throat swelling, SOB or lightheadedness with hypotension: Yes Has patient had a PCN reaction causing severe rash involving mucus membranes or skin necrosis: No Has patient had a PCN reaction that required hospitalization: No Has patient had a PCN  reaction occurring within the last 10 years: No If all of the above answers are "NO", then may proceed with Cephalosporin use.   . Shrimp [Shellfish Allergy] Diarrhea and Nausea And Vomiting    Social History   Socioeconomic History  . Marital status: Married    Spouse name: Not on file  . Number of children: Not on file  . Years of education: Not on file  . Highest education level: Not on file  Occupational History  . Not on file  Social Needs  . Financial resource strain: Not on file  . Food insecurity:    Worry: Not on file    Inability: Not on file  . Transportation needs:    Medical: Not on file    Non-medical: Not on file  Tobacco Use  . Smoking status: Former Smoker    Years: 20.00    Types: Cigars  . Smokeless tobacco: Never Used  . Tobacco comment: "quit asmoking in the 1980s"  Substance and Sexual Activity  . Alcohol use: No  . Drug use: No  . Sexual activity: Not on file  Lifestyle  . Physical activity:    Days per week: Not on file    Minutes per session: Not on file  . Stress: Not on file  Relationships  . Social connections:    Talks on phone: Not on file    Gets together: Not on file    Attends religious service: Not on file    Active member of club or organization: Not on file    Attends meetings of clubs or organizations: Not on file    Relationship status: Not on file  . Intimate partner violence:    Fear of current or ex partner: Not on file    Emotionally abused: Not on file    Physically abused: Not on file    Forced sexual activity: Not on file  Other Topics Concern  . Not on file  Social History Narrative   Lives with spouse in [place]   Retired Event organiser.    Family History  Problem Relation Age of Onset  . Arthritis Mother   . Heart disease Mother   . Heart disease Father   . Arthritis Father   . Healthy Sister   . Heart disease Brother   . Diabetes Brother   . Heart disease Brother   . Heart disease Sister   .  Appendicitis Sister   . Cancer Sister   . Heart disease Sister     ROS- All systems are reviewed and negative except as per the HPI above  Physical Exam: Vitals:   11/18/17 1049  BP: (!) 138/56  Pulse: (!) 58  Weight: 160 lb 3.2 oz (72.7 kg)  Height: 6' (1.829 m)   Wt Readings from Last 3 Encounters:  11/18/17 160 lb 3.2 oz (72.7 kg)  10/14/17 173 lb 1 oz (78.5 kg)  09/23/17 172 lb (78 kg)    Labs: Lab Results  Component Value Date   NA 144 10/12/2017   K 4.3 10/12/2017   CL 108 (H) 10/12/2017   CO2 21 10/12/2017   GLUCOSE 101 (H) 10/12/2017   BUN 31 (H) 10/12/2017   CREATININE 2.02 (H) 10/12/2017   CALCIUM 8.9 10/12/2017   Lab Results  Component Value Date   INR 2.08 10/14/2017   No results found for: CHOL, HDL, LDLCALC, TRIG   GEN- The patient is well appearing, alert and oriented x 3 today.   Head- normocephalic, atraumatic Eyes-  Sclera clear, conjunctiva pink Ears- hearing intact Oropharynx- clear Neck- supple, no JVP Lymph- no cervical lymphadenopathy Lungs- Clear to ausculation bilaterally, normal work of breathing Heart- Regular rate and rhythm, no murmurs, rubs or gallops, PMI not laterally displaced GI- soft, NT, ND, + BS Extremities- no clubbing, cyanosis, or edema MS- no significant deformity or atrophy Skin- no rash or lesion Psych- euthymic mood, full affect Neuro- strength and sensation are intact  EKG- Sinus brady at 58 bpm, pr int 215 ms, qrs int 102 ms, qtc 475 ms  TEE-- Left ventricle: Systolic function was mildly reduced. The estimated ejection fraction was in the range of 45% to 50%. Diffuse hypokinesis. - Aortic valve: There was moderate regurgitation directed centrally in the LVOT. - Mitral valve: Mitral valve leaflets are thickened with bowing of the anterior leaflet. There are three jets of mitral regurgitation contributing to a severe mitral regurgitation. There is systolic forward flow reversal in the left  sided pulmonary veins. - Left atrium: The atrium was dilated. No evidence of thrombus in the atrial cavity or appendage. - Right atrium: No evidence of thrombus in the atrial cavity or appendage. - Atrial septum: No defect or patent foramen ovale was identified. - Tricuspid valve: No evidence of vegetation.  Impressions:  - No cardiac source of emboli was indentified. LVEF 45-50% with diffuse hypokinesis. Severe mitral regurgitation and moderate aortic regurgitation. A repeat TTE post atrial fibrillation ablation is recommended to reevaluate LVEF and degree of mitral regurgitation. If no improvement the patient is a potential mitral clip candidate.    Assessment and Plan: 1. Afib S/p ablation and doing well  Maintaining SR Continue amiodarone 200 mg qd Continue warfarin for a chadsvasc score of at least 5  2. Abnormal TEE TTE was recommended on f/u ablation Scheduled for 4/25  F/u with Dr. Rayann Heman 5/22  Kurt Atkinson. Kurt Atkinson, [facility] [address] [place], [place] [address] [phone]

## 2017-12-03 ENCOUNTER — Ambulatory Visit (HOSPITAL_COMMUNITY)
Admission: RE | Admit: 2017-12-03 | Discharge: 2017-12-03 | Disposition: A | Discharge status: Home / Self Care | Payer: Medicare Other | Source: Ambulatory Visit | Attending: Nurse Practitioner | Admitting: Nurse Practitioner

## 2017-12-03 DIAGNOSIS — I10 Essential (primary) hypertension: Secondary | ICD-10-CM | POA: Insufficient documentation

## 2017-12-03 DIAGNOSIS — E785 Hyperlipidemia, unspecified: Secondary | ICD-10-CM | POA: Diagnosis not present

## 2017-12-03 DIAGNOSIS — I083 Combined rheumatic disorders of mitral, aortic and tricuspid valves: Secondary | ICD-10-CM | POA: Insufficient documentation

## 2017-12-03 DIAGNOSIS — I480 Paroxysmal atrial fibrillation: Secondary | ICD-10-CM | POA: Diagnosis present

## 2017-12-03 DIAGNOSIS — I272 Pulmonary hypertension, unspecified: Secondary | ICD-10-CM | POA: Diagnosis not present

## 2017-12-03 DIAGNOSIS — I2510 Atherosclerotic heart disease of native coronary artery without angina pectoris: Secondary | ICD-10-CM | POA: Diagnosis not present

## 2017-12-03 NOTE — Progress Notes (Signed)
  Echocardiogram 2D Echocardiogram has been performed.  Kurt Atkinson 12/03/2017, 1:48 PM

## 2017-12-08 ENCOUNTER — Encounter (HOSPITAL_COMMUNITY): Payer: Self-pay | Admitting: *Deleted

## 2017-12-30 ENCOUNTER — Encounter: Payer: Self-pay | Admitting: Internal Medicine

## 2017-12-30 ENCOUNTER — Ambulatory Visit: Payer: Medicare Other | Admitting: Internal Medicine

## 2017-12-30 VITALS — BP 120/56 | HR 64 | Ht 72.0 in | Wt 163.0 lb

## 2017-12-30 DIAGNOSIS — I10 Essential (primary) hypertension: Secondary | ICD-10-CM | POA: Diagnosis not present

## 2017-12-30 DIAGNOSIS — I481 Persistent atrial fibrillation: Secondary | ICD-10-CM | POA: Diagnosis not present

## 2017-12-30 DIAGNOSIS — I25810 Atherosclerosis of coronary artery bypass graft(s) without angina pectoris: Secondary | ICD-10-CM | POA: Diagnosis not present

## 2017-12-30 DIAGNOSIS — I4819 Other persistent atrial fibrillation: Secondary | ICD-10-CM

## 2017-12-30 DIAGNOSIS — I484 Atypical atrial flutter: Secondary | ICD-10-CM

## 2017-12-30 DIAGNOSIS — N184 Chronic kidney disease, stage 4 (severe): Secondary | ICD-10-CM

## 2017-12-30 NOTE — Progress Notes (Signed)
PCP: System, Pcp Not In Primary Cardiologist: Kurt Atkinson is a 84 y.o. male who presents today for routine electrophysiology followup.  Since his recent afib ablation, the patient reports doing very well.  he denies procedure related complications and is pleased with the results of the procedure.  Today, he denies symptoms of palpitations, chest pain, shortness of breath,  lower extremity edema, dizziness, presyncope, or syncope.  The patient is otherwise without complaint today.   Past Medical History:  Diagnosis Date  . CAD (coronary artery disease) of artery bypass graft 01/05/2014   CABG,, 2001   . Chronic anticoagulation 01/05/2014   Coumadin   . CKD (chronic kidney disease), stage IV ([place])   . Essential hypertension 01/05/2014  . Hyperlipidemia 01/05/2014  . Hypothyroidism   . Persistent atrial fibrillation ([place]) 01/05/2014       Past Surgical History:  Procedure Laterality Date  . ATRIAL FIBRILLATION ABLATION  10/13/2017  . ATRIAL FIBRILLATION ABLATION [name] 10/13/2017   Procedure: ATRIAL FIBRILLATION ABLATION;  Surgeon: Kurt Grayer, MD;  Location: [place] [facility];  Service: Cardiovascular;  Laterality: [name];  . CARDIAC CATHETERIZATION  2001  . CARDIOVERSION [name] 06/03/2016   Procedure: CARDIOVERSION;  Surgeon: Jerline Pain, MD;  Location: [place];  Service: Cardiovascular;  Laterality: [name];  . CARDIOVERSION [name] 04/01/2017   Procedure: CARDIOVERSION;  Surgeon: Dorothy Spark, MD;  Location: [facility] ENDOSCOPY;  Service: Cardiovascular;  Laterality: [name];  . CORONARY ARTERY BYPASS GRAFT  2001   "CABG X4"  . JOINT REPLACEMENT    . TEE WITHOUT CARDIOVERSION [name] 06/03/2016   Procedure: TRANSESOPHAGEAL ECHOCARDIOGRAM (TEE);  Surgeon: Jerline Pain, MD;  Location: [place];  Service: Cardiovascular;  Laterality: [name];  . TEE WITHOUT CARDIOVERSION [name] 10/13/2017   Procedure: TRANSESOPHAGEAL ECHOCARDIOGRAM (TEE);  Surgeon: Dorothy Spark, MD;  Location: [facility]  ENDOSCOPY;  Service: Cardiovascular;  Laterality: [name];  . TOTAL KNEE ARTHROPLASTY Right     ROS- all systems are personally reviewed and negatives except as per HPI above  Current Outpatient Medications  Medication Sig Dispense Refill  . amiodarone (PACERONE) 200 MG tablet Take 1 tablet (200 mg total) by mouth daily. 90 tablet 1  . Cyanocobalamin (VITAMIN B-12 PO) Take 1 tablet by mouth daily.    Marland Kitchen diltiazem (CARDIZEM SR) 120 MG 12 hr capsule Take 1 capsule (120 mg total) by mouth daily. 30 capsule 11  . levothyroxine (SYNTHROID, LEVOTHROID) 200 MCG tablet Take 100 mcg by mouth daily before breakfast.    . primidone (MYSOLINE) 50 MG tablet Take 50 mg by mouth daily.    . simvastatin (ZOCOR) 40 MG tablet Take 20 mg by mouth daily.    . tamsulosin (FLOMAX) 0.4 MG CAPS capsule Take 0.4 mg by mouth daily.     Marland Kitchen warfarin (COUMADIN) 2.5 MG tablet Take 2.5 mg by mouth every evening.      Current Facility-Administered Medications  Medication Dose Route Frequency Provider Last Rate Last Dose  . sodium chloride flush (NS) 0.9 % injection 3 mL  3 mL Intravenous Q12H Kurt Crome, MD      . sodium chloride flush (NS) 0.9 % injection 3 mL  3 mL Intravenous PRN Kurt Crome, MD        Physical Exam: Vitals:   12/30/17 1403  BP: (!) 120/56  Pulse: 64  Weight: 163 lb (73.9 kg)  Height: 6' (1.829 m)    GEN- The patient is well appearing, alert and oriented x  3 today.   Head- normocephalic, atraumatic Eyes-  Sclera clear, conjunctiva pink Ears- hearing intact Oropharynx- clear Lungs- Clear to ausculation bilaterally, normal work of breathing Heart- Regular rate and rhythm, no murmurs, rubs or gallops, PMI not laterally displaced GI- soft, NT, ND, + BS Extremities- no clubbing, cyanosis, or edema  EKG tracing ordered today is personally reviewed and shows sinus rhythm 64 bpm, QTc 468 msec  Assessment and Plan:  1. Persistent atrial fibrillation/ atypical atrial flutter Doing well s/p  ablation chads2vasc score is 5 Continue coumadin Continue amiodarone Repeat labs upon return  2. Chronic renal failure Stable stage IV renal failure No change required today  3. Severe MR Near resolved on follow-up echo 12/03/17 (reviewed with patient today)  4. CAD No ischemic symptoms  5. HTN Stable No change required today  Return to see me in 3 months  Kurt Grayer MD, [facility] 12/30/2017 2:12 PM

## 2017-12-30 NOTE — Patient Instructions (Addendum)
Medication Instructions:  Your physician recommends that you continue on your current medications as directed. Please refer to the Current Medication list given to you today.  Labwork: None ordered.  Testing/Procedures: None ordered.  Follow-Up: Your physician wants you to follow-up in: 3 months with Dr. Rayann Heman.  You will receive a reminder letter in the mail two months in advance. If you don't receive a letter, please call our office to schedule the follow-up appointment.  If you need a refill on your cardiac medications before your next appointment, please call your pharmacy.

## 2018-03-31 ENCOUNTER — Ambulatory Visit: Payer: Medicare Other | Admitting: Internal Medicine

## 2018-03-31 ENCOUNTER — Encounter: Payer: Self-pay | Admitting: Internal Medicine

## 2018-03-31 VITALS — BP 112/58 | HR 60 | Ht 72.0 in | Wt 157.0 lb

## 2018-03-31 DIAGNOSIS — I481 Persistent atrial fibrillation: Secondary | ICD-10-CM | POA: Diagnosis not present

## 2018-03-31 DIAGNOSIS — I25810 Atherosclerosis of coronary artery bypass graft(s) without angina pectoris: Secondary | ICD-10-CM

## 2018-03-31 DIAGNOSIS — Z9229 Personal history of other drug therapy: Secondary | ICD-10-CM

## 2018-03-31 DIAGNOSIS — I10 Essential (primary) hypertension: Secondary | ICD-10-CM | POA: Diagnosis not present

## 2018-03-31 DIAGNOSIS — I4819 Other persistent atrial fibrillation: Secondary | ICD-10-CM

## 2018-03-31 MED ORDER — AMIODARONE HCL 200 MG PO TABS: 100.0000 mg | ORAL_TABLET | Freq: Every day | ORAL | 1 refills | Status: DC

## 2018-03-31 NOTE — Patient Instructions (Signed)
Medication Instructions:  Your physician has recommended you make the following change in your medication:   1. Reduce your Amiodarone to 100mg , one half tablet, once per day  Labwork: None ordered.  Testing/Procedures: None ordered.  Follow-Up: Your physician wants you to follow-up in: 6 months with Dr Rayann Heman. You will receive a reminder letter in the mail two months in advance. If you don't receive a letter, please call our office to schedule the follow-up appointment.   Any Other Special Instructions Will Be Listed Below (If Applicable).     If you need a refill on your cardiac medications before your next appointment, please call your pharmacy.

## 2018-03-31 NOTE — Progress Notes (Signed)
PCP: System, Pcp Not In Primary Cardiologist: Dr Tamala Julian Primary EP: Dr Rayann Heman  Kurt Atkinson is a 84 y.o. male who presents today for routine electrophysiology followup.  Since last being seen in our clinic, the patient reports doing very well.  Today, he denies symptoms of palpitations, chest pain, shortness of breath,  lower extremity edema, dizziness, presyncope, or syncope.  The patient is otherwise without complaint today.   Past Medical History:  Diagnosis Date  . CAD (coronary artery disease) of artery bypass graft 01/05/2014   CABG,, 2001   . Chronic anticoagulation 01/05/2014   Coumadin   . CKD (chronic kidney disease), stage IV ([place])   . Essential hypertension 01/05/2014  . Hyperlipidemia 01/05/2014  . Hypothyroidism   . Persistent atrial fibrillation ([place]) 01/05/2014       Past Surgical History:  Procedure Laterality Date  . ATRIAL FIBRILLATION ABLATION  10/13/2017  . ATRIAL FIBRILLATION ABLATION [name] 10/13/2017   Procedure: ATRIAL FIBRILLATION ABLATION;  Surgeon: [name], MD;  Location: [place] [facility];  Service: Cardiovascular;  Laterality: [name];  . CARDIAC CATHETERIZATION  2001  . CARDIOVERSION [name] 06/03/2016   Procedure: CARDIOVERSION;  Surgeon: Jerline Pain, MD;  Location: [place];  Service: Cardiovascular;  Laterality: [name];  . CARDIOVERSION [name] 04/01/2017   Procedure: CARDIOVERSION;  Surgeon: Dorothy Spark, MD;  Location: [facility] ENDOSCOPY;  Service: Cardiovascular;  Laterality: [name];  . CORONARY ARTERY BYPASS GRAFT  2001   "CABG X4"  . JOINT REPLACEMENT    . TEE WITHOUT CARDIOVERSION [name] 06/03/2016   Procedure: TRANSESOPHAGEAL ECHOCARDIOGRAM (TEE);  Surgeon: Jerline Pain, MD;  Location: [place];  Service: Cardiovascular;  Laterality: [name];  . TEE WITHOUT CARDIOVERSION [name] 10/13/2017   Procedure: TRANSESOPHAGEAL ECHOCARDIOGRAM (TEE);  Surgeon: Dorothy Spark, MD;  Location: [facility] ENDOSCOPY;  Service: Cardiovascular;  Laterality: [name];  . TOTAL KNEE  ARTHROPLASTY Right     ROS- all systems are reviewed and negatives except as per HPI above  Current Outpatient Medications  Medication Sig Dispense Refill  . amiodarone (PACERONE) 200 MG tablet Take 1 tablet (200 mg total) by mouth daily. 90 tablet 1  . Cyanocobalamin (VITAMIN B-12 PO) Take 1 tablet by mouth daily.    Marland Kitchen diltiazem (CARDIZEM SR) 120 MG 12 hr capsule Take 1 capsule (120 mg total) by mouth daily. 30 capsule 11  . levothyroxine (SYNTHROID, LEVOTHROID) 200 MCG tablet Take 100 mcg by mouth daily before breakfast.    . primidone (MYSOLINE) 50 MG tablet Take 50 mg by mouth daily.    . simvastatin (ZOCOR) 40 MG tablet Take 20 mg by mouth daily.    . tamsulosin (FLOMAX) 0.4 MG CAPS capsule Take 0.4 mg by mouth daily.     Marland Kitchen warfarin (COUMADIN) 2.5 MG tablet Take 2.5 mg by mouth every evening.      Current Facility-Administered Medications  Medication Dose Route Frequency Provider Last Rate Last Dose  . sodium chloride flush (NS) 0.9 % injection 3 mL  3 mL Intravenous Q12H Belva Crome, MD      . sodium chloride flush (NS) 0.9 % injection 3 mL  3 mL Intravenous PRN Belva Crome, MD        Physical Exam: Vitals:   03/31/18 1540  BP: (!) 112/58  Pulse: 60  SpO2: 97%  Weight: 157 lb (71.2 kg)  Height: 6' (1.829 m)    GEN- The patient is well appearing, alert and oriented x 3 today.   Head- normocephalic, atraumatic  Eyes-  Sclera clear, conjunctiva pink Ears- hearing intact Oropharynx- clear Lungs- Clear to ausculation bilaterally, normal work of breathing Heart- Regular rate and rhythm, no murmurs, rubs or gallops, PMI not laterally displaced GI- soft, NT, ND, + BS Extremities- no clubbing, cyanosis, or edema  Wt Readings from Last 3 Encounters:  03/31/18 157 lb (71.2 kg)  12/30/17 163 lb (73.9 kg)  11/18/17 160 lb 3.2 oz (72.7 kg)    EKG tracing ordered today is personally reviewed and shows sinus rhythm 60 bpm, PR 218 msec  Assessment and Plan:  1.  Persistent afib/ atypical atrial flutter Doing very well s/p ablation On coumadin for chads2vasc score of 5 reduce amiodarone to 100mg  daily today  2. Chronic renal failure, stage IV Stable No change required today Followed by nephrology  3. Severe MR, much improved with follow-up echo post ablation (with sinus rhythm)  4. HTN Stable No change required today  5. CAD No ischemic symptoms  Return in 6 months  [name] MD, [facility] 03/31/2018 3:53 PM

## 2018-04-21 ENCOUNTER — Telehealth (HOSPITAL_COMMUNITY): Payer: Self-pay | Admitting: *Deleted

## 2018-04-21 NOTE — Telephone Encounter (Signed)
Patient's daughter called in stating pt went into AF this morning (has been on the 100mg  of amiodarone since mid-august) he went ahead and took another 100mg  of amiodarone and it converted him back to NSR. Pt will continue amiodarone 200mg  a day for the next week then try back at 100mg  a day -- if frequent breakthrough on 100mg  of amiodarone daughter will call back.

## 2018-04-28 ENCOUNTER — Telehealth (HOSPITAL_COMMUNITY): Payer: Self-pay | Admitting: *Deleted

## 2018-04-28 MED ORDER — AMIODARONE HCL 200 MG PO TABS: 200.0000 mg | ORAL_TABLET | Freq: Every day | ORAL | 1 refills | Status: DC

## 2018-04-28 NOTE — Telephone Encounter (Signed)
Patient's daughter, Thayer Headings, called back into today stating her father had another episode of AF ended up taking another 200mg  of amiodarone last night and took until today to break. Worried to decrease amiodarone back down to 100mg  a day as previously instructed as this is his second episode of breakthrough Af in the last 2 weeks. Discussed with Roderic Palau NP will continue Amiodarone at 200mg  a day until follow up with Dr. Rayann Heman in February. May try to decrease at that time. Daughter in agreement.

## 2018-09-29 ENCOUNTER — Ambulatory Visit: Payer: Medicare Other | Admitting: Internal Medicine

## 2018-09-29 ENCOUNTER — Encounter: Payer: Self-pay | Admitting: Internal Medicine

## 2018-09-29 VITALS — BP 144/72 | HR 60 | Ht 72.0 in | Wt 167.0 lb

## 2018-09-29 DIAGNOSIS — I4819 Other persistent atrial fibrillation: Secondary | ICD-10-CM | POA: Diagnosis not present

## 2018-09-29 DIAGNOSIS — I25810 Atherosclerosis of coronary artery bypass graft(s) without angina pectoris: Secondary | ICD-10-CM

## 2018-09-29 DIAGNOSIS — I10 Essential (primary) hypertension: Secondary | ICD-10-CM | POA: Diagnosis not present

## 2018-09-29 NOTE — Progress Notes (Signed)
PCP: Levada Dy Primary Cardiologist: Dr Tamala Julian Primary EP: Dr Trilby Leaver is a 85 y.o. male who presents today for routine electrophysiology followup.  Since last being seen in our clinic, the patient reports doing very well.  Today, he denies symptoms of palpitations, chest pain, shortness of breath,  lower extremity edema, dizziness, presyncope, or syncope.  The patient is otherwise without complaint today.   Past Medical History:  Diagnosis Date  . CAD (coronary artery disease) of artery bypass graft 01/05/2014   CABG,, 2001   . Chronic anticoagulation 01/05/2014   Coumadin   . CKD (chronic kidney disease), stage IV ([place])   . Essential hypertension 01/05/2014  . Hyperlipidemia 01/05/2014  . Hypothyroidism   . Persistent atrial fibrillation 01/05/2014       Past Surgical History:  Procedure Laterality Date  . ATRIAL FIBRILLATION ABLATION  10/13/2017  . ATRIAL FIBRILLATION ABLATION [name] 10/13/2017   Procedure: ATRIAL FIBRILLATION ABLATION;  Surgeon: [name], MD;  Location: [place] [facility];  Service: Cardiovascular;  Laterality: [name];  . CARDIAC CATHETERIZATION  2001  . CARDIOVERSION [name] 06/03/2016   Procedure: CARDIOVERSION;  Surgeon: Jerline Pain, MD;  Location: [place];  Service: Cardiovascular;  Laterality: [name];  . CARDIOVERSION [name] 04/01/2017   Procedure: CARDIOVERSION;  Surgeon: Dorothy Spark, MD;  Location: [facility] ENDOSCOPY;  Service: Cardiovascular;  Laterality: [name];  . CORONARY ARTERY BYPASS GRAFT  2001   "CABG X4"  . JOINT REPLACEMENT    . TEE WITHOUT CARDIOVERSION [name] 06/03/2016   Procedure: TRANSESOPHAGEAL ECHOCARDIOGRAM (TEE);  Surgeon: Jerline Pain, MD;  Location: [place];  Service: Cardiovascular;  Laterality: [name];  . TEE WITHOUT CARDIOVERSION [name] 10/13/2017   Procedure: TRANSESOPHAGEAL ECHOCARDIOGRAM (TEE);  Surgeon: Dorothy Spark, MD;  Location: [facility] ENDOSCOPY;  Service: Cardiovascular;  Laterality: [name];  . TOTAL KNEE  ARTHROPLASTY Right     ROS- all systems are reviewed and negatives except as per HPI above  Current Outpatient Medications  Medication Sig Dispense Refill  . amiodarone (PACERONE) 200 MG tablet Take 1 tablet (200 mg total) by mouth daily. 90 tablet 1  . Cyanocobalamin (VITAMIN B-12 PO) Take 1 tablet by mouth daily.    Marland Kitchen diltiazem (CARDIZEM SR) 120 MG 12 hr capsule Take 1 capsule (120 mg total) by mouth daily. 30 capsule 11  . levothyroxine (SYNTHROID, LEVOTHROID) 200 MCG tablet Take 100 mcg by mouth daily before breakfast.    . primidone (MYSOLINE) 50 MG tablet Take 50 mg by mouth daily.    . simvastatin (ZOCOR) 40 MG tablet Take 20 mg by mouth daily.    . tamsulosin (FLOMAX) 0.4 MG CAPS capsule Take 0.4 mg by mouth daily.     Marland Kitchen warfarin (COUMADIN) 2.5 MG tablet Take 2.5 mg by mouth every evening.      Current Facility-Administered Medications  Medication Dose Route Frequency Provider Last Rate Last Dose  . sodium chloride flush (NS) 0.9 % injection 3 mL  3 mL Intravenous Q12H Belva Crome, MD      . sodium chloride flush (NS) 0.9 % injection 3 mL  3 mL Intravenous PRN Belva Crome, MD        Physical Exam: Vitals:   09/29/18 1056  BP: (!) 144/72  Pulse: 60  SpO2: 96%  Weight: 167 lb (75.8 kg)  Height: 6' (1.829 m)    GEN- The patient is well appearing, alert and oriented x 3 today.   Head- normocephalic, atraumatic Eyes-  Sclera  clear, conjunctiva pink Ears- hearing intact Oropharynx- clear Lungs- Clear to ausculation bilaterally, normal work of breathing Heart- Regular rate and rhythm, no murmurs, rubs or gallops, PMI not laterally displaced GI- soft, NT, ND, + BS Extremities- no clubbing, cyanosis, or edema  Wt Readings from Last 3 Encounters:  09/29/18 167 lb (75.8 kg)  03/31/18 157 lb (71.2 kg)  12/30/17 163 lb (73.9 kg)   Note with labs from nephrology reviewed today.  EKG tracing ordered today is personally reviewed and shows sinus rhythm PR 216 msec,  IVCD  Assessment and Plan:  1. Persistent afib/ atypical atrial flutter Doing well post ablation We tried to reduce amiodarone to 100mg  daily but he had afib.  He is clear that he wishes to continue this medicine long term.  I have advised that he have labs by PCP every 6 months and he states that he will comply. On coumadin for chads2vasc score of 5.  2. MR Improved with sinus rhythm  3. Chronic renal failure, stage IV Stable No change required today  4. CAD No ischemic symptoms  5. HTN Stable No change required today  Return in a year  [name] MD, [facility] 09/29/2018 10:58 AM

## 2018-09-29 NOTE — Patient Instructions (Addendum)
Medication Instructions:  Your physician recommends that you continue on your current medications as directed. Please refer to the Current Medication list given to you today.  * If you need a refill on your cardiac medications before your next appointment, please call your pharmacy.   Labwork: None ordered  Testing/Procedures: None ordered  Follow-Up: Your physician wants you to follow-up in: 1 year with Dr. Rayann Heman.  You will receive a reminder letter in the mail two months in advance. If you don't receive a letter, please call our office to schedule the follow-up appointment.   Thank you for choosing [facility]!!

## 2018-11-24 IMAGING — US US RENAL
1 series · 14 of 25 positions shown · non-contrast
Comparison: None.

CLINICAL DATA: Chronic kidney disease stage 4

EXAM:
RENAL / URINARY TRACT ULTRASOUND COMPLETE

[Series 1: us renal · 0.23mm/px · 14 of 28 slices shown]
[im 1/28]
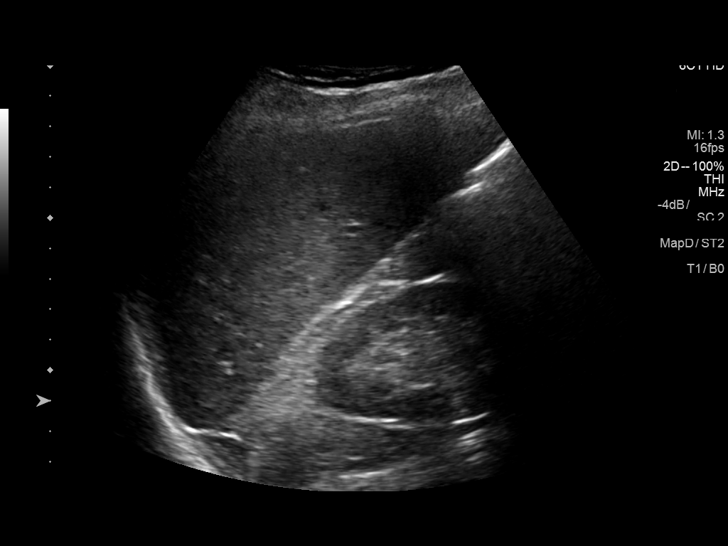
[im 3/28]
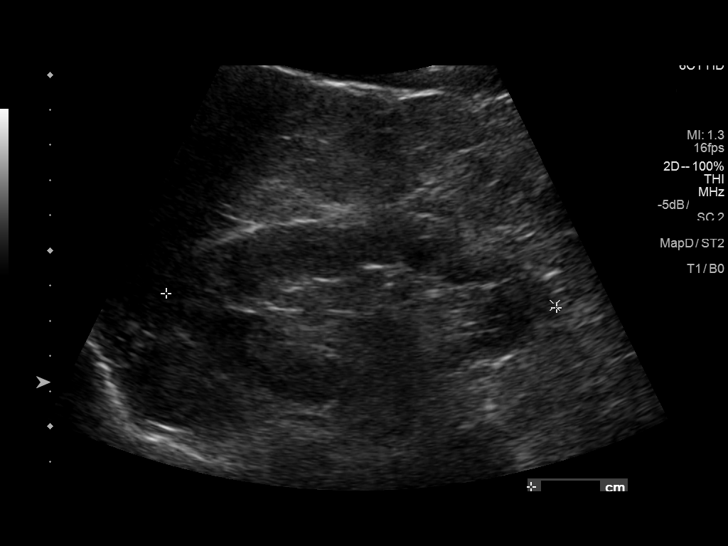
[im 5/28]
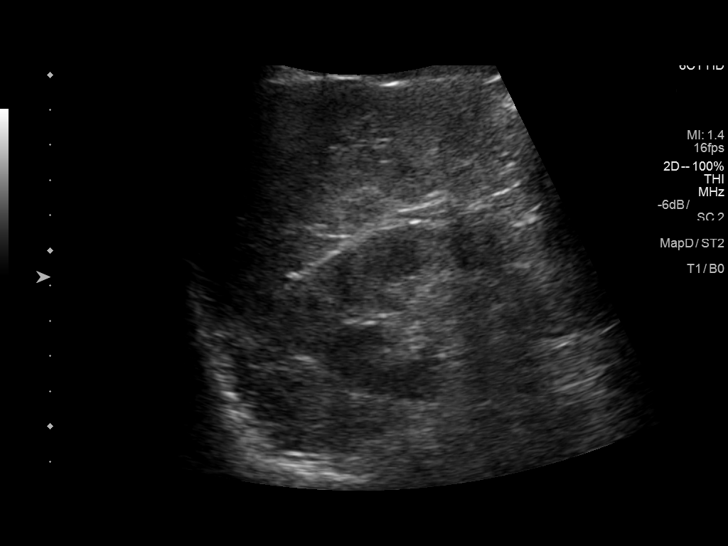
[im 7/28]
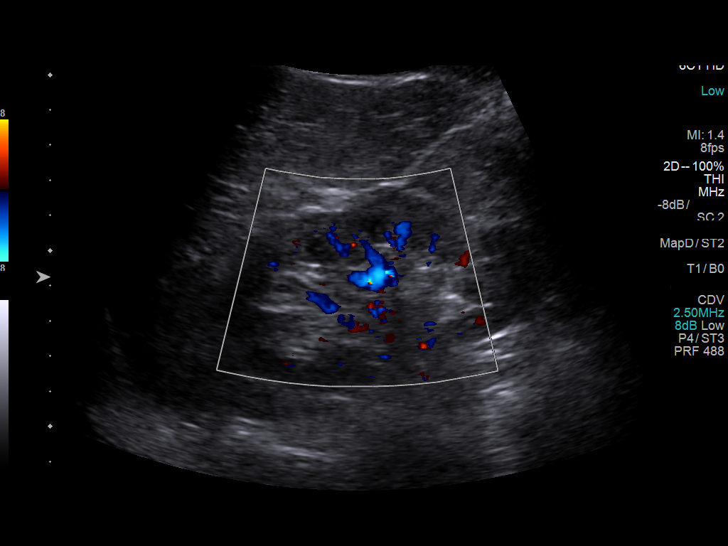
[im 10/28]
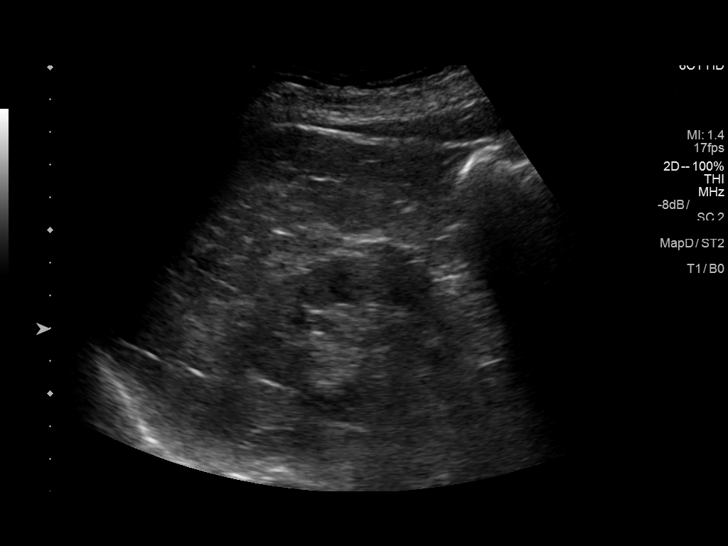
[im 11/28]
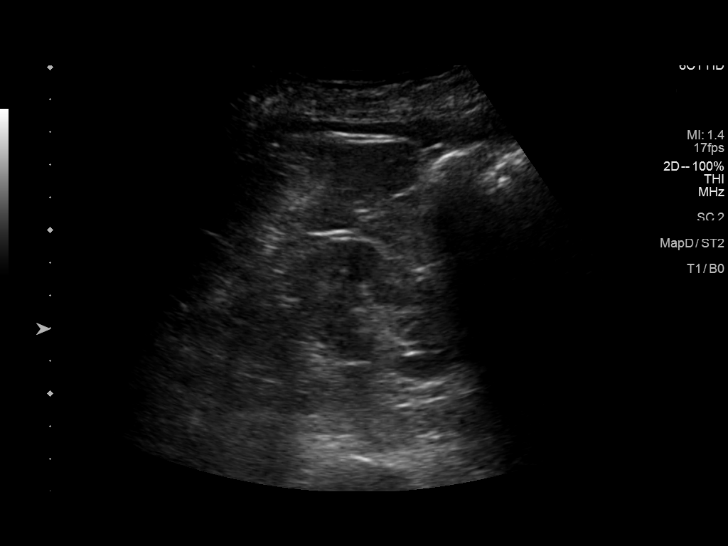
[im 13/28]
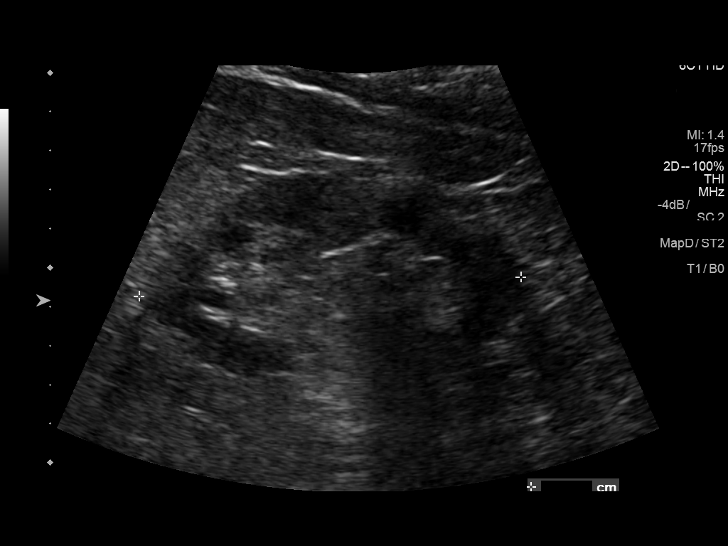
[im 15/28]
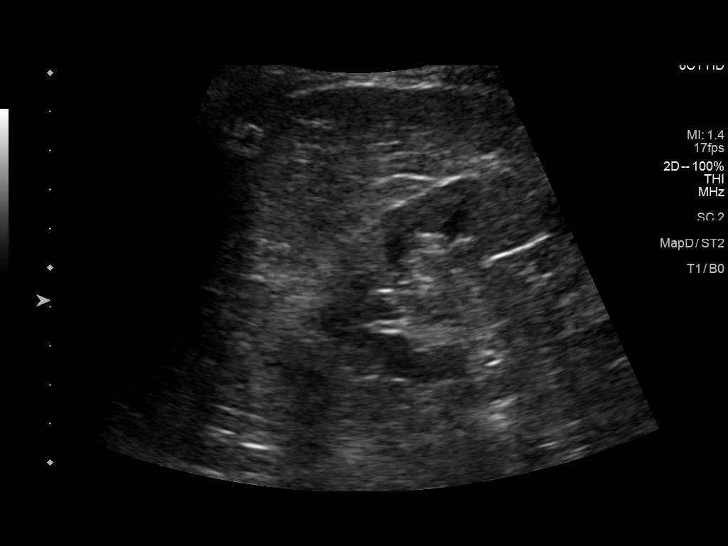
[im 17/28]
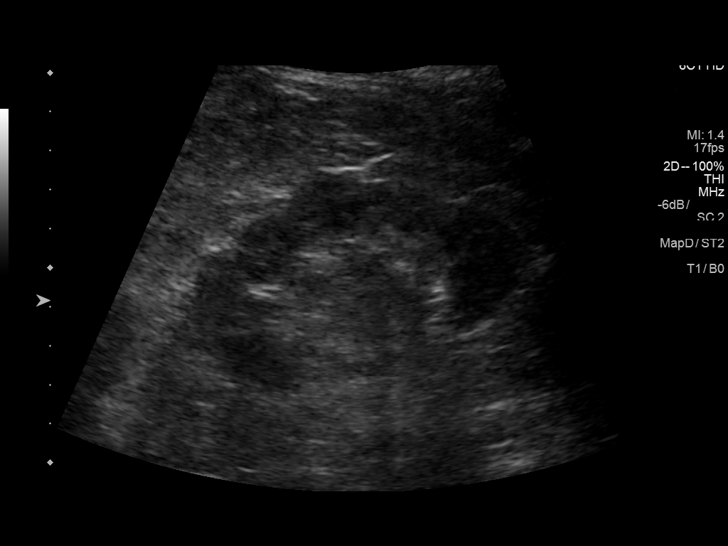
[im 19/28]
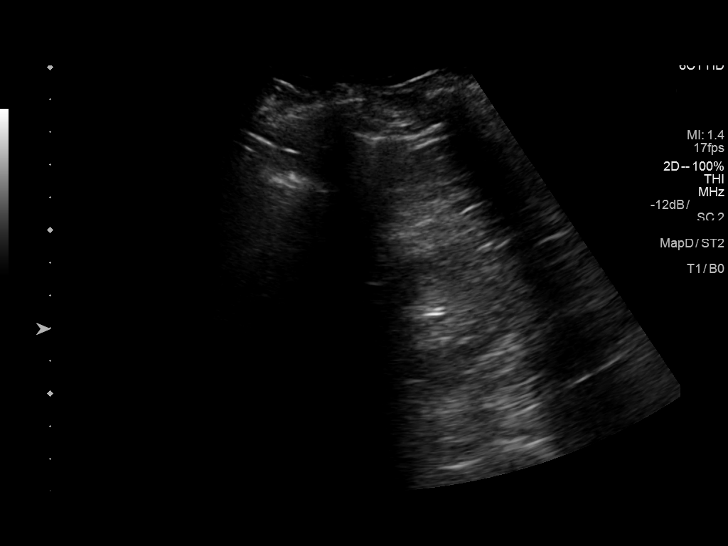
[im 21/28]
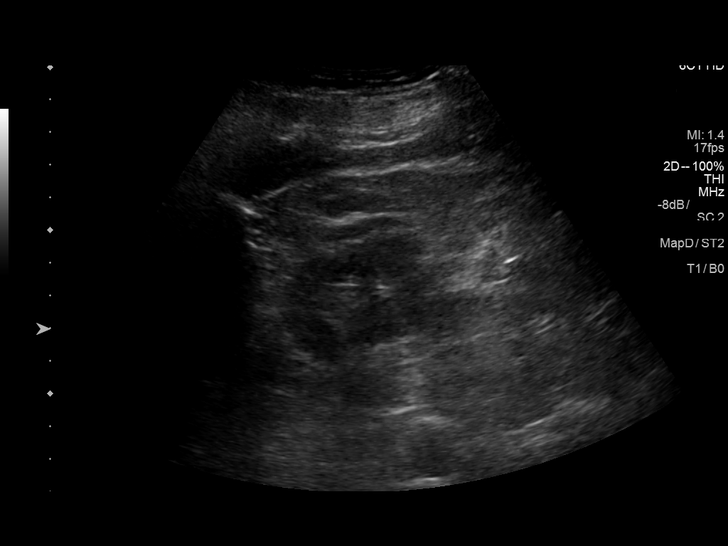
[im 23/28]
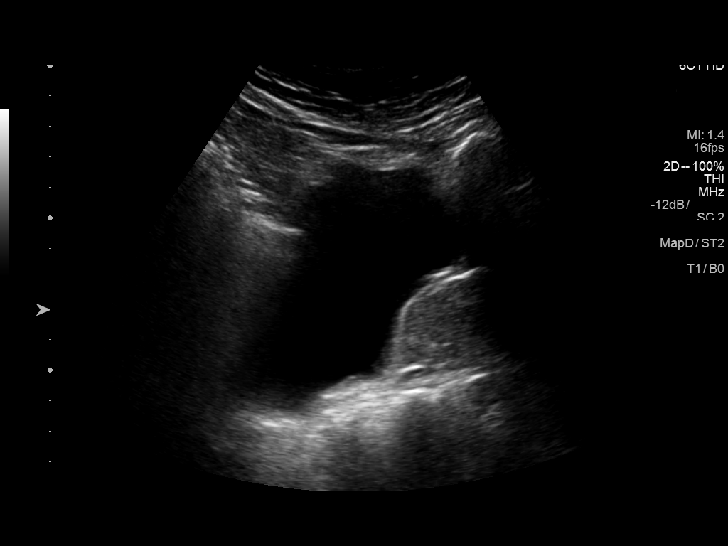
[im 25/28]
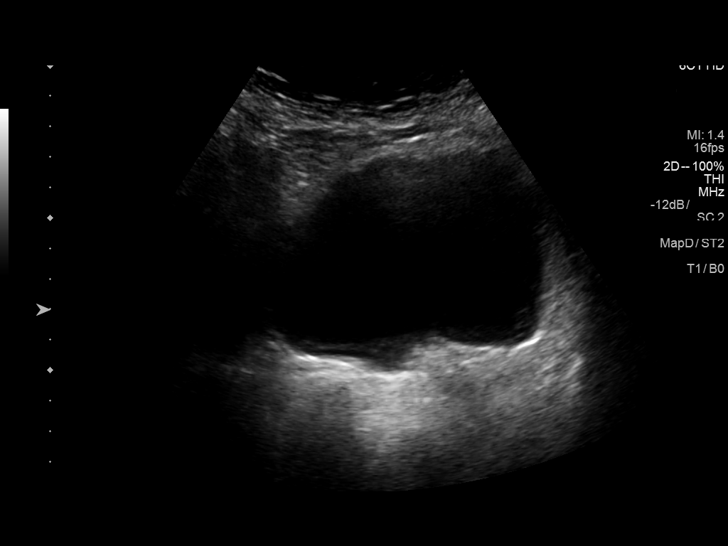
[im 28/28]
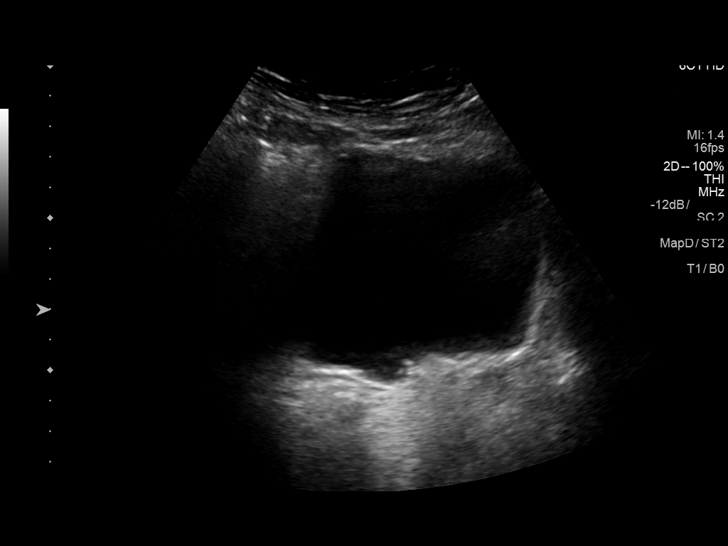

[14 of 25 positions shown; findings below may reference images not displayed]

FINDINGS: Right Kidney:

Length: 11.1 cm. Echogenicity within normal limits. No mass or
hydronephrosis visualized.

Left Kidney:

Length: 9.8 cm. Echogenicity within normal limits. No mass or
hydronephrosis visualized.

Bladder:

Appears normal for degree of bladder distention.
IMPRESSION: Mild asymmetric renal size left smaller than right but with normal
cortical thickness. Negative for hydronephrosis or other significant
abnormality.

## 2019-06-10 ENCOUNTER — Telehealth (HOSPITAL_COMMUNITY): Payer: Self-pay | Admitting: *Deleted

## 2019-06-10 NOTE — Telephone Encounter (Signed)
Patient daughter called in stating fathers heart rates have been 80-100 thinks hes in AF. Shes not sure on the onset - pt recently moved to stay with her and he may not have been compliant with all his meds everyday. Bp 100-110/70s. Per Roderic Palau NP - will increase amiodarone to 200mg  twice a day and follow up next week. Pt daughter in agreement.

## 2019-06-15 ENCOUNTER — Ambulatory Visit (HOSPITAL_COMMUNITY)
Admission: RE | Admit: 2019-06-15 | Discharge: 2019-06-15 | Disposition: A | Discharge status: Home / Self Care | Payer: Medicare Other | Source: Ambulatory Visit | Attending: Nurse Practitioner | Admitting: Nurse Practitioner

## 2019-06-15 ENCOUNTER — Encounter (HOSPITAL_COMMUNITY): Payer: Self-pay | Admitting: Nurse Practitioner

## 2019-06-15 ENCOUNTER — Other Ambulatory Visit: Payer: Self-pay

## 2019-06-15 VITALS — BP 120/54 | HR 77 | Ht 72.0 in | Wt 151.4 lb

## 2019-06-15 DIAGNOSIS — Z96651 Presence of right artificial knee joint: Secondary | ICD-10-CM | POA: Diagnosis not present

## 2019-06-15 DIAGNOSIS — I480 Paroxysmal atrial fibrillation: Secondary | ICD-10-CM

## 2019-06-15 DIAGNOSIS — Z833 Family history of diabetes mellitus: Secondary | ICD-10-CM | POA: Diagnosis not present

## 2019-06-15 DIAGNOSIS — E785 Hyperlipidemia, unspecified: Secondary | ICD-10-CM | POA: Diagnosis not present

## 2019-06-15 DIAGNOSIS — Z91013 Allergy to seafood: Secondary | ICD-10-CM | POA: Insufficient documentation

## 2019-06-15 DIAGNOSIS — I25810 Atherosclerosis of coronary artery bypass graft(s) without angina pectoris: Secondary | ICD-10-CM | POA: Diagnosis not present

## 2019-06-15 DIAGNOSIS — Z87891 Personal history of nicotine dependence: Secondary | ICD-10-CM | POA: Diagnosis not present

## 2019-06-15 DIAGNOSIS — Z880 Allergy status to penicillin: Secondary | ICD-10-CM | POA: Insufficient documentation

## 2019-06-15 DIAGNOSIS — Z79890 Hormone replacement therapy (postmenopausal): Secondary | ICD-10-CM | POA: Insufficient documentation

## 2019-06-15 DIAGNOSIS — Z8249 Family history of ischemic heart disease and other diseases of the circulatory system: Secondary | ICD-10-CM | POA: Insufficient documentation

## 2019-06-15 DIAGNOSIS — Z951 Presence of aortocoronary bypass graft: Secondary | ICD-10-CM | POA: Insufficient documentation

## 2019-06-15 DIAGNOSIS — E039 Hypothyroidism, unspecified: Secondary | ICD-10-CM | POA: Insufficient documentation

## 2019-06-15 DIAGNOSIS — Z79899 Other long term (current) drug therapy: Secondary | ICD-10-CM | POA: Insufficient documentation

## 2019-06-15 DIAGNOSIS — I129 Hypertensive chronic kidney disease with stage 1 through stage 4 chronic kidney disease, or unspecified chronic kidney disease: Secondary | ICD-10-CM | POA: Diagnosis not present

## 2019-06-15 DIAGNOSIS — Z809 Family history of malignant neoplasm, unspecified: Secondary | ICD-10-CM | POA: Insufficient documentation

## 2019-06-15 DIAGNOSIS — N184 Chronic kidney disease, stage 4 (severe): Secondary | ICD-10-CM | POA: Diagnosis not present

## 2019-06-15 DIAGNOSIS — Z7901 Long term (current) use of anticoagulants: Secondary | ICD-10-CM | POA: Insufficient documentation

## 2019-06-15 NOTE — Progress Notes (Signed)
Primary Care Physician: Levada Dy, MD Referring Physician:Dr. Rayann Heman  Kurt Atkinson is a 85 y.o. male with a h/o afib that is here for f/u of elevated irregular heart beats last week reported by daughter. His amiodarone was increased to 200 mg bid, instead of increasing rate control as he was having soft BP's. He is  now back in SR. He went back into rhythm just 2 days after increasing amiodarone.   Today, he denies symptoms of palpitations, chest pain, shortness of breath, orthopnea, PND, lower extremity edema, dizziness, presyncope, syncope, or neurologic sequela. The patient is tolerating medications without difficulties and is otherwise without complaint today.   Past Medical History:  Diagnosis Date  . CAD (coronary artery disease) of artery bypass graft 01/05/2014   CABG,, 2001   . Chronic anticoagulation 01/05/2014   Coumadin   . CKD (chronic kidney disease), stage IV ([place])   . Essential hypertension 01/05/2014  . Hyperlipidemia 01/05/2014  . Hypothyroidism   . Persistent atrial fibrillation ([place]) 01/05/2014       Past Surgical History:  Procedure Laterality Date  . ATRIAL FIBRILLATION ABLATION  10/13/2017  . ATRIAL FIBRILLATION ABLATION [name] 10/13/2017   Procedure: ATRIAL FIBRILLATION ABLATION;  Surgeon: [name], MD;  Location: [place] [facility];  Service: Cardiovascular;  Laterality: [name];  . CARDIAC CATHETERIZATION  2001  . CARDIOVERSION [name] 06/03/2016   Procedure: CARDIOVERSION;  Surgeon: Jerline Pain, MD;  Location: [place];  Service: Cardiovascular;  Laterality: [name];  . CARDIOVERSION [name] 04/01/2017   Procedure: CARDIOVERSION;  Surgeon: Dorothy Spark, MD;  Location: [facility] END[facility];  Service: Cardiovascular;  Laterality: [name];  . CORONARY ARTERY BYPASS GRAFT  2001   "CABG X4"  . JOINT REPLACEMENT    . TEE WITHOUT CARDIOVERSION [name] 06/03/2016   Procedure: TRANSESOPHAGEAL ECHOCARDIOGRAM (TEE);  Surgeon: Jerline Pain, MD;  Location: [place];  Service:  Cardiovascular;  Laterality: [name];  . TEE WITHOUT CARDIOVERSION [name] 10/13/2017   Procedure: TRANSESOPHAGEAL ECHOCARDIOGRAM (TEE);  Surgeon: Dorothy Spark, MD;  Location: [facility] ENDOSCOPY;  Service: Cardiovascular;  Laterality: [name];  . TOTAL KNEE ARTHROPLASTY Right     Current Outpatient Medications  Medication Sig Dispense Refill  . amiodarone (PACERONE) 200 MG tablet Take 1 tablet (200 mg total) by mouth daily. 90 tablet 1  . Cyanocobalamin (VITAMIN B-12 PO) Take 1 tablet by mouth daily.    Marland Kitchen diltiazem (CARDIZEM SR) 120 MG 12 hr capsule Take 1 capsule (120 mg total) by mouth daily. 30 capsule 11  . levothyroxine (SYNTHROID) 100 MCG tablet Take 100 mcg by mouth daily.    . primidone (MYSOLINE) 50 MG tablet Take 50 mg by mouth daily.    . simvastatin (ZOCOR) 20 MG tablet Take 20 mg by mouth at bedtime.    . tamsulosin (FLOMAX) 0.4 MG CAPS capsule Take 0.4 mg by mouth daily.     Marland Kitchen warfarin (COUMADIN) 2.5 MG tablet Take 2.5 mg by mouth every evening.      Current Facility-Administered Medications  Medication Dose Route Frequency Provider Last Rate Last Dose  . sodium chloride flush (NS) 0.9 % injection 3 mL  3 mL Intravenous Q12H Belva Crome, MD      . sodium chloride flush (NS) 0.9 % injection 3 mL  3 mL Intravenous PRN Belva Crome, MD        Allergies  Allergen Reactions  . Penicillins Itching, Rash and Other (See Comments)    Has patient had a PCN reaction causing  immediate rash, facial/tongue/throat swelling, SOB or lightheadedness with hypotension: Yes Has patient had a PCN reaction causing severe rash involving mucus membranes or skin necrosis: No Has patient had a PCN reaction that required hospitalization: No Has patient had a PCN reaction occurring within the last 10 years: No If all of the above answers are "NO", then may proceed with Cephalosporin use.   . Shrimp [Shellfish Allergy] Diarrhea and Nausea And Vomiting    Social History   Socioeconomic History  . Marital  status: Married    Spouse name: Scientist, research (physical sciences)  . Number of children: Not on file  . Years of education: Not on file  . Highest education level: Not on file  Occupational History  . Occupation: RETIRED  Social Needs  . Financial resource strain: Not on file  . Food insecurity    Worry: Not on file    Inability: Not on file  . Transportation needs    Medical: Not on file    Non-medical: Not on file  Tobacco Use  . Smoking status: Former Smoker    Years: 20.00    Types: Cigars  . Smokeless tobacco: Never Used  . Tobacco comment: "quit asmoking in the 1980s"  Substance and Sexual Activity  . Alcohol use: No  . Drug use: No  . Sexual activity: Not on file  Lifestyle  . Physical activity    Days per week: Not on file    Minutes per session: Not on file  . Stress: Not on file  Relationships  . Social Herbalist on phone: Not on file    Gets together: Not on file    Attends religious service: Not on file    Active member of club or organization: Not on file    Attends meetings of clubs or organizations: Not on file    Relationship status: Not on file  . Intimate partner violence    Fear of current or ex partner: Not on file    Emotionally abused: Not on file    Physically abused: Not on file    Forced sexual activity: Not on file  Other Topics Concern  . Not on file  Social History Narrative   Lives with spouse in [place]   Retired Event organiser.    Family History  Problem Relation Age of Onset  . Arthritis Mother   . Heart disease Mother   . Heart disease Father   . Arthritis Father   . Healthy Sister   . Heart disease Brother   . Diabetes Brother   . Heart disease Brother   . Heart disease Sister   . Appendicitis Sister   . Cancer Sister   . Heart disease Sister     ROS- All systems are reviewed and negative except as per the HPI above  Physical Exam: Vitals:   06/15/19 1001  BP: (!) 120/54  Pulse: 77  Weight: 68.7 kg  Height: 6'  (1.829 m)   Wt Readings from Last 3 Encounters:  06/15/19 68.7 kg  09/29/18 75.8 kg  03/31/18 71.2 kg    Labs: Lab Results  Component Value Date   NA 144 10/12/2017   K 4.3 10/12/2017   CL 108 (H) 10/12/2017   CO2 21 10/12/2017   GLUCOSE 101 (H) 10/12/2017   BUN 31 (H) 10/12/2017   CREATININE 2.02 (H) 10/12/2017   CALCIUM 8.9 10/12/2017   Lab Results  Component Value Date   INR 2.08 10/14/2017   No  results found for: CHOL, HDL, LDLCALC, TRIG   GEN- The patient is well appearing, alert and oriented x 3 today.   Head- normocephalic, atraumatic Eyes-  Sclera clear, conjunctiva pink Ears- hearing intact Oropharynx- clear Neck- supple, no JVP Lymph- no cervical lymphadenopathy Lungs- Clear to ausculation bilaterally, normal work of breathing Heart- Regular rate and rhythm, no murmurs, rubs or gallops, PMI not laterally displaced GI- soft, NT, ND, + BS Extremities- no clubbing, cyanosis, or edema MS- no significant deformity or atrophy Skin- no rash or lesion Psych- euthymic mood, full affect Neuro- strength and sensation are intact  EKG-NSR at 77 bpm, pr int 198 ms, qrs int 100 ms, qtc 450 ms Epic records reviewed    Assessment and Plan: 1. Paroxysmal afib  Increase in burden last week Now back in rhythm with short  increase in of amiodarone to 200 mg bid  Go back to 200 mg qd Continue diltiazem 120 mg qd  2. CHA2DS2VASc score of at least 4 Continue warfarin 2.5 mg daily   F/u with Dr. Rayann Heman as scheduled 2/22 afib clinic as needed  Butch Penny C. Lannette Avellino, [facility] [address] [place], [place] [address] [phone]

## 2019-06-15 NOTE — Patient Instructions (Signed)
Reduce amiodarone to 200 mg once a day.

## 2019-09-29 ENCOUNTER — Other Ambulatory Visit: Payer: Self-pay

## 2019-09-29 ENCOUNTER — Telehealth (INDEPENDENT_AMBULATORY_CARE_PROVIDER_SITE_OTHER): Payer: Medicare Other | Admitting: Internal Medicine

## 2019-09-29 ENCOUNTER — Encounter: Payer: Self-pay | Admitting: Internal Medicine

## 2019-09-29 VITALS — BP 124/50 | HR 61 | Ht 72.0 in | Wt 147.0 lb

## 2019-09-29 DIAGNOSIS — I129 Hypertensive chronic kidney disease with stage 1 through stage 4 chronic kidney disease, or unspecified chronic kidney disease: Secondary | ICD-10-CM

## 2019-09-29 DIAGNOSIS — N184 Chronic kidney disease, stage 4 (severe): Secondary | ICD-10-CM | POA: Diagnosis not present

## 2019-09-29 DIAGNOSIS — I4819 Other persistent atrial fibrillation: Secondary | ICD-10-CM | POA: Diagnosis not present

## 2019-09-29 DIAGNOSIS — I2510 Atherosclerotic heart disease of native coronary artery without angina pectoris: Secondary | ICD-10-CM

## 2019-09-29 DIAGNOSIS — I484 Atypical atrial flutter: Secondary | ICD-10-CM | POA: Diagnosis not present

## 2019-09-29 DIAGNOSIS — I25810 Atherosclerosis of coronary artery bypass graft(s) without angina pectoris: Secondary | ICD-10-CM

## 2019-09-29 DIAGNOSIS — I340 Nonrheumatic mitral (valve) insufficiency: Secondary | ICD-10-CM

## 2019-09-29 DIAGNOSIS — I10 Essential (primary) hypertension: Secondary | ICD-10-CM

## 2019-09-29 DIAGNOSIS — D6869 Other thrombophilia: Secondary | ICD-10-CM

## 2019-09-29 NOTE — Progress Notes (Signed)
Electrophysiology TeleHealth Note  Due to national recommendations of social distancing due to [place] 19, an audio telehealth visit is felt to be most appropriate for this patient at this time.  Verbal consent was obtained by me for the telehealth visit today.  The patient does not have capability for a virtual visit.  A phone visit is therefore required today.   Date:  09/29/2019   ID:  Kurt Atkinson, DOB 02/01/1932, MRN [id number]  Location: patient's home  Provider location:  [place]  Evaluation Performed: Follow-up visit  PCP:  Levada Dy, MD   Electrophysiologist:  Dr Rayann Heman  Chief Complaint:  palpitations  History of Present Illness:    Kurt Atkinson is a 86 y.o. male who presents via telehealth conferencing today.  Since last being seen in our clinic, the patient reports doing very well. his afib is well controlled.  His wife fell and was hospitalized for a prolonged period.  He was not eating and lost substantial weight.  His appetite has improved now that his wife is home.   Today, he denies symptoms of palpitations, chest pain, shortness of breath,  lower extremity edema, dizziness, presyncope, or syncope.  The patient is otherwise without complaint today.  The patient denies symptoms of fevers, chills, cough, or new SOB worrisome for COVID 19.  Past Medical History:  Diagnosis Date  . CAD (coronary artery disease) of artery bypass graft 01/05/2014   CABG,, 2001   . Chronic anticoagulation 01/05/2014   Coumadin   . CKD (chronic kidney disease), stage IV ([place])   . Essential hypertension 01/05/2014  . Hyperlipidemia 01/05/2014  . Hypothyroidism   . Persistent atrial fibrillation ([place]) 01/05/2014        Past Surgical History:  Procedure Laterality Date  . ATRIAL FIBRILLATION ABLATION  10/13/2017  . ATRIAL FIBRILLATION ABLATION [name] 10/13/2017   Procedure: ATRIAL FIBRILLATION ABLATION;  Surgeon: [name], MD;  Location: [place] [facility];  Service:  Cardiovascular;  Laterality: [name];  . CARDIAC CATHETERIZATION  2001  . CARDIOVERSION [name] 06/03/2016   Procedure: CARDIOVERSION;  Surgeon: Jerline Pain, MD;  Location: [place];  Service: Cardiovascular;  Laterality: [name];  . CARDIOVERSION [name] 04/01/2017   Procedure: CARDIOVERSION;  Surgeon: Dorothy Spark, MD;  Location: [facility] ENDOSCOPY;  Service: Cardiovascular;  Laterality: [name];  . CORONARY ARTERY BYPASS GRAFT  2001   "CABG X4"  . JOINT REPLACEMENT    . TEE WITHOUT CARDIOVERSION [name] 06/03/2016   Procedure: TRANSESOPHAGEAL ECHOCARDIOGRAM (TEE);  Surgeon: Jerline Pain, MD;  Location: [place];  Service: Cardiovascular;  Laterality: [name];  . TEE WITHOUT CARDIOVERSION [name] 10/13/2017   Procedure: TRANSESOPHAGEAL ECHOCARDIOGRAM (TEE);  Surgeon: Dorothy Spark, MD;  Location: [facility] ENDOSCOPY;  Service: Cardiovascular;  Laterality: [name];  . TOTAL KNEE ARTHROPLASTY Right     Current Outpatient Medications  Medication Sig Dispense Refill  . amiodarone (PACERONE) 200 MG tablet Take 1 tablet (200 mg total) by mouth daily. 90 tablet 1  . Cyanocobalamin (VITAMIN B-12 PO) Take 1 tablet by mouth daily.    Marland Kitchen diltiazem (CARDIZEM SR) 120 MG 12 hr capsule Take 1 capsule (120 mg total) by mouth daily. 30 capsule 11  . levothyroxine (SYNTHROID) 100 MCG tablet Take 100 mcg by mouth daily.    . primidone (MYSOLINE) 50 MG tablet Take 50 mg by mouth daily.    . simvastatin (ZOCOR) 20 MG tablet Take 20 mg by mouth at bedtime.    . tamsulosin (FLOMAX) 0.4  MG CAPS capsule Take 0.4 mg by mouth daily.     Marland Kitchen warfarin (COUMADIN) 2.5 MG tablet Take 2.5 mg by mouth every evening.      Current Facility-Administered Medications  Medication Dose Route Frequency Provider Last Rate Last Admin  . sodium chloride flush (NS) 0.9 % injection 3 mL  3 mL Intravenous Q12H Belva Crome, MD      . sodium chloride flush (NS) 0.9 % injection 3 mL  3 mL Intravenous PRN Belva Crome, MD        Allergies:   Penicillins and  Shrimp [shellfish allergy]   Social History:  The patient  reports that he has quit smoking. His smoking use included cigars. He quit after 20.00 years of use. He has never used smokeless tobacco. He reports that he does not drink alcohol or use drugs.   Family History:  The patient's family history includes Appendicitis in his sister; Arthritis in his father and mother; Cancer in his sister; Diabetes in his brother; Healthy in his sister; Heart disease in his brother, brother, father, mother, sister, and sister.   ROS:  Please see the history of present illness.   All other systems are personally reviewed and negative.    Exam:    Vital Signs:  BP (!) 124/50   Pulse 61   Ht 6' (1.829 m)   Wt 147 lb (66.7 kg)   BMI 19.94 kg/m   Well sounding, alert and conversant   Labs/Other Tests and Data Reviewed:    Recent Labs: No results found for requested labs within last 8760 hours.   Wt Readings from Last 3 Encounters:  09/29/19 147 lb (66.7 kg)  06/15/19 151 lb 6.4 oz (68.7 kg)  09/29/18 167 lb (75.8 kg)      ASSESSMENT & PLAN:    1.  Persistent atrial fibrillation/ atypical atrial flutter Doing well post ablation with low dose amiodarone chads2vasc score is 4.  He is on coumadin. The importance of close INR monitoring to avoid toxicity and risks of this medicine were discussed at length today.  2. Chronic renal failure stage IV Stable No change required today  3. MR Improved with sinus  4. CAD No ischemic symptoms  5. HTN Stable No change required today   Follow-up:  with me in a year, virtual visit would be ok   Patient Risk:  after full review of this patients clinical status, I feel that they are at high risk at this time.  Close INR monitoring is advised today.  Today, I have spent 20 minutes with the patient with telehealth technology discussing arrhythmia management .    Army Fossa, MD  09/29/2019 11:15 AM     [facility] [address] [place] [address] [phone] (office) [phone] (fax)

## 2019-10-03 ENCOUNTER — Ambulatory Visit: Payer: Medicare Other | Admitting: Internal Medicine

## 2020-08-11 DEATH — deceased

## 2020-10-10 ENCOUNTER — Ambulatory Visit: Payer: Medicare Other | Admitting: Internal Medicine

## 2020-10-31 ENCOUNTER — Other Ambulatory Visit: Payer: Self-pay

## 2020-10-31 ENCOUNTER — Ambulatory Visit: Payer: Medicare Other | Admitting: Internal Medicine

## 2020-10-31 ENCOUNTER — Encounter: Payer: Self-pay | Admitting: Internal Medicine

## 2020-10-31 VITALS — BP 162/66 | HR 63 | Ht 72.0 in | Wt 152.0 lb

## 2020-10-31 DIAGNOSIS — I484 Atypical atrial flutter: Secondary | ICD-10-CM

## 2020-10-31 DIAGNOSIS — I10 Essential (primary) hypertension: Secondary | ICD-10-CM | POA: Diagnosis not present

## 2020-10-31 DIAGNOSIS — I4819 Other persistent atrial fibrillation: Secondary | ICD-10-CM

## 2020-10-31 DIAGNOSIS — N184 Chronic kidney disease, stage 4 (severe): Secondary | ICD-10-CM

## 2020-10-31 NOTE — Patient Instructions (Addendum)
Medication Instructions:  Your physician recommends that you continue on your current medications as directed. Please refer to the Current Medication list given to you today.  Labwork: None ordered.  Testing/Procedures: None ordered.  Follow-Up: Your physician wants you to follow-up in: one year with       Tommye Standard, PA-C     You will receive a reminder letter in the mail two months in advance. If you don't receive a letter, please call our office to schedule the follow-up appointment.   Any Other Special Instructions Will Be Listed Below (If Applicable).  If you need a refill on your cardiac medications before your next appointment, please call your pharmacy.

## 2020-10-31 NOTE — Progress Notes (Signed)
PCP: Melvia Heaps   Primary EP: Dr Trilby Leaver is a 87 y.o. male who presents today for routine electrophysiology followup.  Since last being seen in our clinic, the patient reports doing very well.  Today, he denies symptoms of palpitations, chest pain, shortness of breath,  lower extremity edema, dizziness, presyncope, or syncope.  The patient is otherwise without complaint today.   Past Medical History:  Diagnosis Date  . CAD (coronary artery disease) of artery bypass graft 01/05/2014   CABG,, 2001   . Chronic anticoagulation 01/05/2014   Coumadin   . CKD (chronic kidney disease), stage IV ([place])   . Essential hypertension 01/05/2014  . Hyperlipidemia 01/05/2014  . Hypothyroidism   . Persistent atrial fibrillation ([place]) 01/05/2014       Past Surgical History:  Procedure Laterality Date  . ATRIAL FIBRILLATION ABLATION  10/13/2017  . ATRIAL FIBRILLATION ABLATION [name] 10/13/2017   Procedure: ATRIAL FIBRILLATION ABLATION;  Surgeon: [name], MD;  Location: [place] [facility];  Service: Cardiovascular;  Laterality: [name];  . CARDIAC CATHETERIZATION  2001  . CARDIOVERSION [name] 06/03/2016   Procedure: CARDIOVERSION;  Surgeon: Jerline Pain, MD;  Location: [place];  Service: Cardiovascular;  Laterality: [name];  . CARDIOVERSION [name] 04/01/2017   Procedure: CARDIOVERSION;  Surgeon: Dorothy Spark, MD;  Location: [facility] ENDOSCOPY;  Service: Cardiovascular;  Laterality: [name];  . CORONARY ARTERY BYPASS GRAFT  2001   "CABG X4"  . JOINT REPLACEMENT    . TEE WITHOUT CARDIOVERSION [name] 06/03/2016   Procedure: TRANSESOPHAGEAL ECHOCARDIOGRAM (TEE);  Surgeon: Jerline Pain, MD;  Location: [place];  Service: Cardiovascular;  Laterality: [name];  . TEE WITHOUT CARDIOVERSION [name] 10/13/2017   Procedure: TRANSESOPHAGEAL ECHOCARDIOGRAM (TEE);  Surgeon: Dorothy Spark, MD;  Location: [facility] ENDOSCOPY;  Service: Cardiovascular;  Laterality: [name];  . TOTAL KNEE ARTHROPLASTY Right     ROS- all  systems are reviewed and negatives except as per HPI above  Current Outpatient Medications  Medication Sig Dispense Refill  . amiodarone (PACERONE) 200 MG tablet Take 1 tablet (200 mg total) by mouth daily. 90 tablet 1  . Cyanocobalamin (VITAMIN B-12 PO) Take 1 tablet by mouth daily.    Marland Kitchen diltiazem (CARDIZEM SR) 120 MG 12 hr capsule Take 1 capsule (120 mg total) by mouth daily. 30 capsule 11  . levothyroxine (SYNTHROID) 100 MCG tablet Take 100 mcg by mouth daily.    . primidone (MYSOLINE) 50 MG tablet Take 50 mg by mouth daily.    . simvastatin (ZOCOR) 20 MG tablet Take 20 mg by mouth at bedtime.    . tamsulosin (FLOMAX) 0.4 MG CAPS capsule Take 0.4 mg by mouth daily.     Marland Kitchen warfarin (COUMADIN) 2.5 MG tablet Take 2.5 mg by mouth every evening.      Current Facility-Administered Medications  Medication Dose Route Frequency Provider Last Rate Last Admin  . sodium chloride flush (NS) 0.9 % injection 3 mL  3 mL Intravenous Q12H Belva Crome, MD      . sodium chloride flush (NS) 0.9 % injection 3 mL  3 mL Intravenous PRN Belva Crome, MD        Physical Exam: Vitals:   10/31/20 1516  BP: (!) 162/66  Pulse: 63  SpO2: 95%  Weight: 152 lb (68.9 kg)  Height: 6' (1.829 m)    GEN- The patient is well appearing, alert and oriented x 3 today.   Head- normocephalic, atraumatic Eyes-  Sclera clear, conjunctiva pink  Ears- hearing intact Oropharynx- clear Lungs- Clear to ausculation bilaterally, normal work of breathing Heart- Regular rate and rhythm, no murmurs, rubs or gallops, PMI not laterally displaced GI- soft, NT, ND, + BS Extremities- no clubbing, cyanosis, or edema  Wt Readings from Last 3 Encounters:  10/31/20 152 lb (68.9 kg)  09/29/19 147 lb (66.7 kg)  06/15/19 151 lb 6.4 oz (68.7 kg)    EKG tracing ordered today is personally reviewed and shows sinus with first degree AV block  Assessment and Plan:  1. Persistent atrial fibrillation/ atypical atrial flutter Doing very  well post ablation Remains on amiodarone and doing well We will follow him closely on this medicine to avoid toxicity  Labs from PCP reviewed today  2. HTN Stable No change required today  3. CAD No ischemic symptoms  4. CRI, stage IV Stable No change required today Follows with nephrology   Risks, benefits and potential toxicities for medications prescribed and/or refilled reviewed with patient today.   Return to see EP PA annually  [name] MD, [facility] 10/31/2020 3:30 PM

## 2021-12-02 NOTE — Progress Notes (Deleted)
PCP:  Melvia Heaps Primary Cardiologist: None Electrophysiologist: [name], MD   Kurt Atkinson is a 88 y.o. male seen today for [name], MD for routine electrophysiology followup.  Since last being seen in our clinic the patient reports doing ***.  he denies chest pain, palpitations, dyspnea, PND, orthopnea, nausea, vomiting, dizziness, syncope, edema, weight gain, or early satiety.  Past Medical History:  Diagnosis Date   CAD (coronary artery disease) of artery bypass graft 01/05/2014   CABG,, 2001    Chronic anticoagulation 01/05/2014   Coumadin    CKD (chronic kidney disease), stage IV ([place])    Essential hypertension 01/05/2014   Hyperlipidemia 01/05/2014   Hypothyroidism    Persistent atrial fibrillation ([place]) 01/05/2014       Past Surgical History:  Procedure Laterality Date   ATRIAL FIBRILLATION ABLATION  10/13/2017   ATRIAL FIBRILLATION ABLATION [name] 10/13/2017   Procedure: ATRIAL FIBRILLATION ABLATION;  Surgeon: [name], MD;  Location: [place] [facility];  Service: Cardiovascular;  Laterality: [name];   CARDIAC CATHETERIZATION  2001   CARDIOVERSION [name] 06/03/2016   Procedure: CARDIOVERSION;  Surgeon: Jerline Pain, MD;  Location: [place];  Service: Cardiovascular;  Laterality: [name];   CARDIOVERSION [name] 04/01/2017   Procedure: CARDIOVERSION;  Surgeon: Dorothy Spark, MD;  Location[facility] ENDOSCOPY;  Service: Cardiovascular;  Laterality: [name];   CORONARY ARTERY BYPASS GRAFT  2001   "CABG X4"   JOINT REPLACEMENT     TEE WITHOUT CARDIOVERSION [name] 06/03/2016   Procedure: TRANSESOPHAGEAL ECHOCARDIOGRAM (TEE);  Surgeon: Jerline Pain, MD;  Location: [place];  Service: Cardiovascular;  Laterality: [name];   TEE WITHOUT CARDIOVERSION [name] 10/13/2017   Procedure: TRANSESOPHAGEAL ECHOCARDIOGRAM (TEE);  Surgeon: Dorothy Spark, MD;  Location: [facility] ENDOSCOPY;  Service: Cardiovascular;  Laterality: [name];   TOTAL KNEE ARTHROPLASTY Right     Current Outpatient  Medications  Medication Sig Dispense Refill   amiodarone (PACERONE) 200 MG tablet Take 1 tablet (200 mg total) by mouth daily. 90 tablet 1   Cyanocobalamin (VITAMIN B-12 PO) Take 1 tablet by mouth daily.     diltiazem (CARDIZEM SR) 120 MG 12 hr capsule Take 1 capsule (120 mg total) by mouth daily. 30 capsule 11   levothyroxine (SYNTHROID) 100 MCG tablet Take 100 mcg by mouth daily.     primidone (MYSOLINE) 50 MG tablet Take 50 mg by mouth daily.     simvastatin (ZOCOR) 20 MG tablet Take 20 mg by mouth at bedtime.     tamsulosin (FLOMAX) 0.4 MG CAPS capsule Take 0.4 mg by mouth daily.      warfarin (COUMADIN) 2.5 MG tablet Take 2.5 mg by mouth every evening.      Current Facility-Administered Medications  Medication Dose Route Frequency Provider Last Rate Last Admin   sodium chloride flush (NS) 0.9 % injection 3 mL  3 mL Intravenous Q12H Belva Crome, MD       sodium chloride flush (NS) 0.9 % injection 3 mL  3 mL Intravenous PRN Belva Crome, MD        Allergies  Allergen Reactions   Penicillins Itching, Rash and Other (See Comments)    Has patient had a PCN reaction causing immediate rash, facial/tongue/throat swelling, SOB or lightheadedness with hypotension: Yes Has patient had a PCN reaction causing severe rash involving mucus membranes or skin necrosis: No Has patient had a PCN reaction that required hospitalization: No Has patient had a PCN reaction occurring within the last 10 years: No  If all of the above answers are "NO", then may proceed with Cephalosporin use.    Shrimp [Shellfish Allergy] Diarrhea and Nausea And Vomiting    Social History   Socioeconomic History   Marital status: Married    Spouse name: Scientist, research (physical sciences)   Number of children: Not on file   Years of education: Not on file   Highest education level: Not on file  Occupational History   Occupation: RETIRED  Tobacco Use   Smoking status: Former    Types: Cigars   Smokeless tobacco: Never   Tobacco  comments:    "quit asmoking in the 1980s"  Vaping Use   Vaping Use: Never used  Substance and Sexual Activity   Alcohol use: No   Drug use: No   Sexual activity: Not on file  Other Topics Concern   Not on file  Social History Narrative   Lives with spouse in [place]   Retired Event organiser.   Social Determinants of Health   Financial Resource Strain: Not on file  Food Insecurity: Not on file  Transportation Needs: Not on file  Physical Activity: Not on file  Stress: Not on file  Social Connections: Not on file  Intimate Partner Violence: Not on file     Review of Systems: All other systems reviewed and are otherwise negative except as noted above.  Physical Exam: There were no vitals filed for this visit.  GEN- The patient is well appearing, alert and oriented x 3 today.   HEENT: normocephalic, atraumatic; sclera clear, conjunctiva pink; hearing intact; oropharynx clear; neck supple, no JVP Lymph- no cervical lymphadenopathy Lungs- Clear to ausculation bilaterally, normal work of breathing.  No wheezes, rales, rhonchi Heart- Regular rate and rhythm, no murmurs, rubs or gallops, PMI not laterally displaced GI- soft, non-tender, non-distended, bowel sounds present, no hepatosplenomegaly Extremities- no clubbing, cyanosis, or edema; DP/PT/radial pulses 2+ bilaterally MS- no significant deformity or atrophy Skin- warm and dry, no rash or lesion Psych- euthymic mood, full affect Neuro- strength and sensation are intact  EKG is ordered. Personal review of EKG from today shows ***  Additional studies reviewed include: Previous EP office notes. ***  Assessment and Plan:  1. Persistent atrial fibrillation/ atypical atrial flutter Doing well post ablation Remains on amiodarone 200 mg daily. Surveillance labs today.    2. HTN Stable on current regimen    3. CAD No ishcemic symptoms.    4. CRI, stage IV Stable. Sees nephrology  Follow up with {Blank  single:19197::"Dr. Allred","Dr. Arlan Organ. Klein","Dr. Camnitz","Dr. Lambert","EP APP"} in {Blank single:19197::"2 weeks","4 weeks","3 months","6 months","12 months","as usual post gen change"}   Shirley Friar, [place]  12/02/21 9:51 AM

## 2021-12-05 ENCOUNTER — Ambulatory Visit: Payer: Medicare Other | Admitting: Student

## 2022-01-20 NOTE — Progress Notes (Signed)
PCP:  Melvia Heaps Primary Cardiologist: None Electrophysiologist: [name], MD   Kurt Atkinson is a 88 y.o. male seen today for [name], MD for routine electrophysiology followup.  Since last being seen in our clinic the patient reports doing well overall. He was hospitalized for SBO in April. Has been doing OK since. He notices tachy palpitations and mild fatigue with more than moderate exertion. No bleeding on [place]. he denies chest pain, PND, orthopnea, nausea, vomiting, dizziness, syncope, edema, weight gain, or early satiety.  Past Medical History:  Diagnosis Date   CAD (coronary artery disease) of artery bypass graft 01/05/2014   CABG,, 2001    Chronic anticoagulation 01/05/2014   Coumadin    CKD (chronic kidney disease), stage IV ([place])    Essential hypertension 01/05/2014   Hyperlipidemia 01/05/2014   Hypothyroidism    Persistent atrial fibrillation ([place]) 01/05/2014       Past Surgical History:  Procedure Laterality Date   ATRIAL FIBRILLATION ABLATION  10/13/2017   ATRIAL FIBRILLATION ABLATION [name] 10/13/2017   Procedure: ATRIAL FIBRILLATION ABLATION;  Surgeon: [name], MD;  Location: [place] [facility];  Service: Cardiovascular;  Laterality: [name];   CARDIAC CATHETERIZATION  2001   CARDIOVERSION [name] 06/03/2016   Procedure: CARDIOVERSION;  Surgeon: Jerline Pain, MD;  Location: [place];  Service: Cardiovascular;  Laterality: [name];   CARDIOVERSION [name] 04/01/2017   Procedure: CARDIOVERSION;  Surgeon: Dorothy Spark, MD;  Location: [facility] ENDOSCOPY;  Service: Cardiovascular;  Laterality: [name];   CORONARY ARTERY BYPASS GRAFT  2001   "CABG X4"   JOINT REPLACEMENT     TEE WITHOUT CARDIOVERSION [name] 06/03/2016   Procedure: TRANSESOPHAGEAL ECHOCARDIOGRAM (TEE);  Surgeon: Jerline Pain, MD;  Location: [place];  Service: Cardiovascular;  Laterality: [name];   TEE WITHOUT CARDIOVERSION [name] 10/13/2017   Procedure: TRANSESOPHAGEAL ECHOCARDIOGRAM (TEE);  Surgeon: Dorothy Spark, MD;  Location: [facility] ENDOSCOPY;  Service: Cardiovascular;  Laterality: [name];   TOTAL KNEE ARTHROPLASTY Right     Current Outpatient Medications  Medication Sig Dispense Refill   amiodarone (PACERONE) 200 MG tablet Take 1 tablet (200 mg total) by mouth daily. 90 tablet 1   apixaban (ELIQUIS) 5 MG TABS tablet Take 5 mg by mouth 2 (two) times daily.     diltiazem (CARDIZEM SR) 120 MG 12 hr capsule Take 1 capsule (120 mg total) by mouth daily. 30 capsule 11   levothyroxine (SYNTHROID) 100 MCG tablet Take 100 mcg by mouth daily.     primidone (MYSOLINE) 50 MG tablet Take 50 mg by mouth daily.     rosuvastatin (CRESTOR) 20 MG tablet Take 20 mg by mouth at bedtime.     tamsulosin (FLOMAX) 0.4 MG CAPS capsule Take 0.4 mg by mouth daily.      Vitamin D, Ergocalciferol, (DRISDOL) 1.25 MG (50000 UNIT) CAPS capsule Take 1 capsule by mouth once a week.     Current Facility-Administered Medications  Medication Dose Route Frequency Provider Last Rate Last Admin   sodium chloride flush (NS) 0.9 % injection 3 mL  3 mL Intravenous Q12H Belva Crome, MD       sodium chloride flush (NS) 0.9 % injection 3 mL  3 mL Intravenous PRN Belva Crome, MD        Allergies  Allergen Reactions   Penicillins Itching, Rash and Other (See Comments)    Has patient had a PCN reaction causing immediate rash, facial/tongue/throat swelling, SOB or lightheadedness with hypotension: Yes Has patient had  a PCN reaction causing severe rash involving mucus membranes or skin necrosis: No Has patient had a PCN reaction that required hospitalization: No Has patient had a PCN reaction occurring within the last 10 years: No If all of the above answers are "NO", then may proceed with Cephalosporin use.    Shrimp [Shellfish Allergy] Diarrhea and Nausea And Vomiting    Social History   Socioeconomic History   Marital status: Widowed    Spouse name: Scientist, research (physical sciences)   Number of children: Not on file   Years of  education: Not on file   Highest education level: Not on file  Occupational History   Occupation: RETIRED  Tobacco Use   Smoking status: Former    Types: Cigars   Smokeless tobacco: Never   Tobacco comments:    "quit asmoking in the 1980s"  Vaping Use   Vaping Use: Never used  Substance and Sexual Activity   Alcohol use: No   Drug use: No   Sexual activity: Not on file  Other Topics Concern   Not on file  Social History Narrative   Lives with spouse in [place]   Retired Event organiser.   Social Determinants of Health   Financial Resource Strain: Not on file  Food Insecurity: Not on file  Transportation Needs: Not on file  Physical Activity: Not on file  Stress: Not on file  Social Connections: Not on file  Intimate Partner Violence: Not on file     Review of Systems: All other systems reviewed and are otherwise negative except as noted above.  Physical Exam: Vitals:   01/28/22 1159  BP: (!) 112/52  SpO2: 98%  Weight: 149 lb (67.6 kg)  Height: 6' (1.829 m)    GEN- The patient is well appearing, alert and oriented x 3 today.   HEENT: normocephalic, atraumatic; sclera clear, conjunctiva pink; hearing intact; oropharynx clear; neck supple, no JVP Lymph- no cervical lymphadenopathy Lungs- Clear to ausculation bilaterally, normal work of breathing.  No wheezes, rales, rhonchi Heart- Regular rate and rhythm, no murmurs, rubs or gallops, PMI not laterally displaced GI- soft, non-tender, non-distended, bowel sounds present, no hepatosplenomegaly Extremities- no clubbing, cyanosis, or edema; DP/PT/radial pulses 2+ bilaterally MS- no significant deformity or atrophy Skin- warm and dry, no rash or lesion Psych- euthymic mood, full affect Neuro- strength and sensation are intact  EKG is ordered. Personal review of EKG from today shows likely atrial flutter at 104 bpm  Additional studies reviewed include: Previous EP office notes.   Assessment and Plan:  1.  Persistent atrial fibrillation 2. Atypical atrial flutter History of ablation with PVI and ablation of a second discrete focus (flutter) 2019. Stable on amidoarone. Surveillance labs today. Family wishes to continue for now to control his atrial fibrillation which he tolerates less, and leave him in flutter.. They feel like any medication changes lead to exacerbations. They understand amiodarone will still require monitoring.  EKG today shows atrial flutter. HRs improved to 70-80s with rest.  Pt refuses cardioversion. Discussed that amiodarone may at times be used as rate control. Pt and family wish to continue.   3. CAD Denies s/s ischemia  4. HTN Stable on current regimen   5. CKD IV Follows with nephrology  Follow up with Dr. Curt Bears in 6 months   Shirley Friar, PA-C  01/28/22 12:03 PM

## 2022-01-28 ENCOUNTER — Encounter: Payer: Self-pay | Admitting: Student

## 2022-01-28 ENCOUNTER — Ambulatory Visit: Payer: Medicare Other | Admitting: Student

## 2022-01-28 VITALS — BP 112/52 | Ht 72.0 in | Wt 149.0 lb

## 2022-01-28 DIAGNOSIS — I10 Essential (primary) hypertension: Secondary | ICD-10-CM | POA: Diagnosis not present

## 2022-01-28 DIAGNOSIS — I484 Atypical atrial flutter: Secondary | ICD-10-CM | POA: Diagnosis not present

## 2022-01-28 DIAGNOSIS — N184 Chronic kidney disease, stage 4 (severe): Secondary | ICD-10-CM

## 2022-01-28 DIAGNOSIS — I4819 Other persistent atrial fibrillation: Secondary | ICD-10-CM | POA: Diagnosis not present

## 2022-01-28 NOTE — Patient Instructions (Signed)
Medication Instructions:  Your physician recommends that you continue on your current medications as directed. Please refer to the Current Medication list given to you today.  *If you need a refill on your cardiac medications before your next appointment, please call your pharmacy*   Lab Work: TODAY: CMET, CBC, TSH. FreeT4  If you have labs (blood work) drawn today and your tests are completely normal, you will receive your results only by: [place] (if you have MyChart) OR A paper copy in the mail If you have any lab test that is abnormal or we need to change your treatment, we will call you to review the results.   Follow-Up: At [facility], you and your health needs are our priority.  As part of our continuing mission to provide you with exceptional heart care, we have created designated Provider Care Teams.  These Care Teams include your primary Cardiologist (physician) and Advanced Practice Providers (APPs -  Physician Assistants and Nurse Practitioners) who all work together to provide you with the care you need, when you need it.   Your next appointment:   6 month(s)  The format for your next appointment:   In Person  Provider:   Allegra Lai, MD{

## 2022-01-29 LAB — CBC
Hematocrit: 37.2 % — ABNORMAL LOW (ref 37.5–51.0)
Hemoglobin: 12.1 g/dL — ABNORMAL LOW (ref 13.0–17.7)
MCH: 29.4 pg (ref 26.6–33.0)
MCHC: 32.5 g/dL (ref 31.5–35.7)
MCV: 91 fL (ref 79–97)
Platelets: 105 10*3/uL — ABNORMAL LOW (ref 150–450)
RBC: 4.11 x10E6/uL — ABNORMAL LOW (ref 4.14–5.80)
RDW: 13.8 % (ref 11.6–15.4)
WBC: 8.2 10*3/uL (ref 3.4–10.8)

## 2022-01-29 LAB — COMPREHENSIVE METABOLIC PANEL
ALT: 13 IU/L (ref 0–44)
AST: 16 IU/L (ref 0–40)
Albumin/Globulin Ratio: 1.4 (ref 1.2–2.2)
Albumin: 3.8 g/dL (ref 3.6–4.6)
Alkaline Phosphatase: 99 IU/L (ref 44–121)
BUN/Creatinine Ratio: 20 (ref 10–24)
BUN: 30 mg/dL — ABNORMAL HIGH (ref 8–27)
Bilirubin Total: <0.2 mg/dL (ref 0.0–1.2)
CO2: 22 mmol/L (ref 20–29)
Calcium: 8.5 mg/dL — ABNORMAL LOW (ref 8.6–10.2)
Chloride: 108 mmol/L — ABNORMAL HIGH (ref 96–106)
Creatinine, Ser: 1.47 mg/dL — ABNORMAL HIGH (ref 0.76–1.27)
Globulin, Total: 2.7 g/dL (ref 1.5–4.5)
Glucose: 92 mg/dL (ref 70–99)
Potassium: 4.2 mmol/L (ref 3.5–5.2)
Sodium: 142 mmol/L (ref 134–144)
Total Protein: 6.5 g/dL (ref 6.0–8.5)
eGFR: 46 mL/min/{1.73_m2} — ABNORMAL LOW (ref >59)

## 2022-01-29 LAB — TSH: TSH: 1.54 u[IU]/mL (ref 0.450–4.500)

## 2022-01-29 LAB — T4, FREE: Free T4: 1.9 ng/dL — ABNORMAL HIGH (ref 0.82–1.77)

## 2022-05-06 MED ORDER — DILTIAZEM HCL ER 120 MG PO CP12: 120.0000 mg | ORAL_CAPSULE | Freq: Every day | ORAL | 2 refills | Status: DC

## 2022-07-22 ENCOUNTER — Ambulatory Visit: Payer: Medicare Other | Attending: Cardiology | Admitting: Cardiology

## 2022-07-22 ENCOUNTER — Encounter: Payer: Self-pay | Admitting: Cardiology

## 2022-07-22 VITALS — BP 130/62 | HR 61 | Ht 72.0 in | Wt 150.8 lb

## 2022-07-22 DIAGNOSIS — I4819 Other persistent atrial fibrillation: Secondary | ICD-10-CM | POA: Diagnosis not present

## 2022-07-22 DIAGNOSIS — D6869 Other thrombophilia: Secondary | ICD-10-CM

## 2022-07-22 DIAGNOSIS — I2510 Atherosclerotic heart disease of native coronary artery without angina pectoris: Secondary | ICD-10-CM | POA: Diagnosis not present

## 2022-07-22 DIAGNOSIS — Z79899 Other long term (current) drug therapy: Secondary | ICD-10-CM

## 2022-07-22 NOTE — Patient Instructions (Signed)
Medication Instructions:  Your physician recommends that you continue on your current medications as directed. Please refer to the Current Medication list given to you today.  *If you need a refill on your cardiac medications before your next appointment, please call your pharmacy*   Lab Work: Amiodarone surveillance labs today: TSH & LFTs  If you have labs (blood work) drawn today and your tests are completely normal, you will receive your results only by: [place] (if you have MyChart) OR A paper copy in the mail If you have any lab test that is abnormal or we need to change your treatment, we will call you to review the results.   Testing/Procedures: None ordered   Follow-Up: At [facility], you and your health needs are our priority.  As part of our continuing mission to provide you with exceptional heart care, we have created designated Provider Care Teams.  These Care Teams include your primary Cardiologist (physician) and Advanced Practice Providers (APPs -  Physician Assistants and Nurse Practitioners) who all work together to provide you with the care you need, when you need it.  We recommend signing up for the patient portal called "MyChart".  Sign up information is provided on this After Visit Summary.  MyChart is used to connect with patients for Virtual Visits (Telemedicine).  Patients are able to view lab/test results, encounter notes, upcoming appointments, etc.  Non-urgent messages can be sent to your provider as well.   To learn more about what you can do with MyChart, go to [email or web address].    Your next appointment:   6 month(s)  The format for your next appointment:   In Person  Provider:   Legrand Como "Jonni Sanger" Chalmers Cater, PA-C    Thank you for choosing [facility]!!   Trinidad Curet, RN [phone]  Other Instructions     Important Information About Sugar

## 2022-07-22 NOTE — Progress Notes (Signed)
Electrophysiology Office Note   Date:  07/22/2022   ID:  Kurt Atkinson, DOB [birth year], MRN [id number]  PCP:  Kurt Atkinson  Cardiologist:   Primary Electrophysiologist:  Kurt Langston Meredith Leeds, MD    Chief Complaint: AF   History of Present Illness: Kurt Atkinson is a 88 y.o. male who is being seen today for the evaluation of AF at the request of Kurt Atkinson. Presenting today for electrophysiology evaluation.  History significant for coronary artery disease post CABG in 2001, CKD stage IV, hypertension, hyperlipidemia, persistent atrial fibrillation.  Today, he denies symptoms of palpitations, chest Atkinson, shortness of breath, orthopnea, PND, lower extremity edema, claudication, dizziness, presyncope, syncope, bleeding, or neurologic sequela. The patient is tolerating medications without difficulties.    Past Medical History:  Diagnosis Date   CAD (coronary artery disease) of artery bypass graft 01/05/2014   CABG,, 2001    Chronic anticoagulation 01/05/2014   Coumadin    CKD (chronic kidney disease), stage IV ([place])    Essential hypertension 01/05/2014   Hyperlipidemia 01/05/2014   Hypothyroidism    Persistent atrial fibrillation ([place]) 01/05/2014       Past Surgical History:  Procedure Laterality Date   ATRIAL FIBRILLATION ABLATION  10/13/2017   ATRIAL FIBRILLATION ABLATION [name] 10/13/2017   Procedure: ATRIAL FIBRILLATION ABLATION;  Surgeon: Kurt Grayer, MD;  Location: [place] [facility];  Service: Cardiovascular;  Laterality: [name];   CARDIAC CATHETERIZATION  2001   CARDIOVERSION [name] 06/03/2016   Procedure: CARDIOVERSION;  Surgeon: Kurt Pain, MD;  Location: [place];  Service: Cardiovascular;  Laterality: [name];   CARDIOVERSION [name] 04/01/2017   Procedure: CARDIOVERSION;  Surgeon: Kurt Spark, MD;  Location: [facility] ENDOSCOPY;  Service: Cardiovascular;  Laterality: [name];   CORONARY ARTERY BYPASS GRAFT  2001   "CABG X4"   JOINT REPLACEMENT     TEE WITHOUT CARDIOVERSION  [name] 06/03/2016   Procedure: TRANSESOPHAGEAL ECHOCARDIOGRAM (TEE);  Surgeon: Kurt Pain, MD;  Location: [place];  Service: Cardiovascular;  Laterality: [name];   TEE WITHOUT CARDIOVERSION [name] 10/13/2017   Procedure: TRANSESOPHAGEAL ECHOCARDIOGRAM (TEE);  Surgeon: Kurt Spark, MD;  Location: [facility] ENDOSCOPY;  Service: Cardiovascular;  Laterality: [name];   TOTAL KNEE ARTHROPLASTY Right      Current Outpatient Medications  Medication Sig Dispense Refill   amiodarone (PACERONE) 200 MG tablet Take 1 tablet (200 mg total) by mouth daily. 90 tablet 1   apixaban (ELIQUIS) 5 MG TABS tablet Take 5 mg by mouth 2 (two) times daily.     diltiazem (CARDIZEM SR) 120 MG 12 hr capsule Take 1 capsule (120 mg total) by mouth daily. 90 capsule 2   levothyroxine (SYNTHROID) 100 MCG tablet Take 100 mcg by mouth daily.     primidone (MYSOLINE) 50 MG tablet Take 50 mg by mouth daily.     rosuvastatin (CRESTOR) 20 MG tablet Take 20 mg by mouth at bedtime.     tamsulosin (FLOMAX) 0.4 MG CAPS capsule Take 0.4 mg by mouth daily.      Vitamin D, Ergocalciferol, (DRISDOL) 1.25 MG (50000 UNIT) CAPS capsule Take 1 capsule by mouth once a week.     Current Facility-Administered Medications  Medication Dose Route Frequency Provider Last Rate Last Admin   sodium chloride flush (NS) 0.9 % injection 3 mL  3 mL Intravenous Q12H Kurt Crome, MD       sodium chloride flush (NS) 0.9 % injection 3 mL  3 mL Intravenous PRN Kurt Crome, MD  Allergies:   Penicillins and Shrimp [shellfish allergy]   Social History:  The patient  reports that he has quit smoking. His smoking use included cigars. He has never used smokeless tobacco. He reports that he does not drink alcohol and does not use drugs.   Family History:  The patient's family history includes Appendicitis in his sister; Arthritis in his father and mother; Cancer in his sister; Diabetes in his brother; Healthy in his sister; Heart disease in his brother,  brother, father, mother, sister, and sister.    ROS:  Please see the history of present illness.   Otherwise, review of systems is positive for none.   All other systems are reviewed and negative.    PHYSICAL EXAM: VS:  BP 130/62   Pulse 61   Ht 6' (1.829 m)   Wt 150 lb 12.8 oz (68.4 kg)   SpO2 98%   BMI 20.45 kg/m  , BMI Body mass index is 20.45 kg/m. GEN: Well nourished, well developed, in no acute distress  HEENT: normal  Neck: no JVD, carotid bruits, or masses Cardiac: RRR; no murmurs, rubs, or gallops,no edema  Respiratory:  clear to auscultation bilaterally, normal work of breathing GI: soft, nontender, nondistended, + BS MS: no deformity or atrophy  Skin: warm and dry Neuro:  Strength and sensation are intact Psych: euthymic mood, full affect  EKG:  EKG is ordered today. Personal review of the ekg ordered shows sinus rhythm, rate 61  Recent Labs: 01/28/2022: ALT 13; BUN 30; Creatinine, Ser 1.47; Hemoglobin 12.1; Platelets 105; Potassium 4.2; Sodium 142; TSH 1.540    Lipid Panel  No results found for: "CHOL", "TRIG", "HDL", "CHOLHDL", "VLDL", "LDLCALC", "LDLDIRECT"   Wt Readings from Last 3 Encounters:  07/22/22 150 lb 12.8 oz (68.4 kg)  01/28/22 149 lb (67.6 kg)  10/31/20 152 lb (68.9 kg)      Other studies Reviewed: Additional studies/ records that were reviewed today include: TTE 2019  Review of the above records today demonstrates:  - Left ventricle: The cavity size was mildly dilated. Wall    thickness was normal. Systolic function was normal. The estimated    ejection fraction was in the range of 55% to 60%. Wall motion was    normal; there were no regional wall motion abnormalities. Doppler    parameters are consistent with restrictive physiology, indicative    of decreased left ventricular diastolic compliance and/or    increased left atrial pressure. Doppler parameters are consistent    with high ventricular filling pressure.  - Aortic valve: There  was mild regurgitation.  - Mitral valve: Calcified annulus. There was mild regurgitation.  - Left atrium: The atrium was severely dilated.  - Pulmonary arteries: Systolic pressure was mildly increased. PA    peak pressure: 33 mm Hg (S).    ASSESSMENT AND PLAN:  1.  Persistent atrial fibrillation/atypical atrial flutter: CHA2DS2-VASc of at least 4.  Currently on Eliquis 5 mg twice daily, amiodarone 200 mg daily.  Post PVI in 2019.  Currently on amiodarone 200 mg daily.  Remains in sinus rhythm today.  He did have an episode of atrial flutter previously.  No changes.  2.  Coronary artery disease: Status post CABG.  No ischemic symptoms.  No changes.  3.  Hypertension: Currently well-controlled  4.  Secondary hypercoagulable state: Currently on Eliquis for atrial fibrillation as above  5.  CKD stage IV: Followed by nephrology  6.  High risk medication monitoring: Currently on amiodarone for atrial  fibrillation as above.  Marijane Trower check surveillance labs today.  See  Current medicines are reviewed at length with the patient today.   The patient does not have concerns regarding his medicines.  The following changes were made today:  none  Labs/ tests ordered today include:  Orders Placed This Encounter  Procedures   Hepatic function panel   TSH   EKG 12-Lead     Disposition:   FU 6 months  Signed, Doris Gruhn Meredith Leeds, MD  07/22/2022 2:10 PM     [address] [place] [address] [phone] (office) [phone] (fax)

## 2022-07-23 LAB — HEPATIC FUNCTION PANEL
ALT: 19 IU/L (ref 0–44)
AST: 17 IU/L (ref 0–40)
Albumin: 3.6 g/dL — ABNORMAL LOW (ref 3.7–4.7)
Alkaline Phosphatase: 97 IU/L (ref 44–121)
Bilirubin Total: 0.3 mg/dL (ref 0.0–1.2)
Bilirubin, Direct: 0.12 mg/dL (ref 0.00–0.40)
Total Protein: 6 g/dL (ref 6.0–8.5)

## 2022-07-23 LAB — TSH: TSH: 1.74 u[IU]/mL (ref 0.450–4.500)

## 2022-09-19 ENCOUNTER — Other Ambulatory Visit: Payer: Self-pay

## 2022-09-19 MED ORDER — DILTIAZEM HCL ER 120 MG PO CP12: 120.0000 mg | ORAL_CAPSULE | Freq: Every day | ORAL | 3 refills | Status: DC

## 2022-12-10 LAB — LAB REPORT - SCANNED: A1c: 5.2

## 2023-01-20 NOTE — Progress Notes (Signed)
  Electrophysiology Office Note:   Date:  01/21/2023  ID:  PRANAY HILBUN, DOB [birth year], MRN [id number]  Primary Cardiologist: None Electrophysiologist: Will Jorja Loa, MD   History of Present Illness:   Kurt Atkinson is a 89 y.o. male with h/o CAD s/p CABG 2001, CKD IV, HTN, HLD, and persistent AF seen today for routine electrophysiology followup.   Since last being seen in our clinic the patient reports doing well overall from a cardiac perspective.  he denies chest pain, palpitations, dyspnea, PND, orthopnea, nausea, vomiting, dizziness, syncope, edema, weight gain, or early satiety.   Review of systems complete and found to be negative unless listed in HPI.   Studies Reviewed:    EKG is ordered today. Personal review shows sinus brady at 58 bpm   Physical Exam:   VS:  BP (!) 150/60   Pulse (!) 58   Ht 6\' 2"  (1.88 m)   Wt 154 lb 9.6 oz (70.1 kg)   SpO2 97%   BMI 19.85 kg/m    Wt Readings from Last 3 Encounters:  01/21/23 154 lb 9.6 oz (70.1 kg)  07/22/22 150 lb 12.8 oz (68.4 kg)  01/28/22 149 lb (67.6 kg)     GEN: Well nourished, well developed in no acute distress NECK: No JVD; No carotid bruits CARDIAC: Regular rate and rhythm, no murmurs, rubs, gallops RESPIRATORY:  Clear to auscultation without rales, wheezing or rhonchi  ABDOMEN: Soft, non-tender, non-distended EXTREMITIES:  No edema; No deformity   ASSESSMENT AND PLAN:    Persistent atrial fibrillation Atypical atrial flutter History of ablation with PVI and ablation of a second discrete focus (flutter) 2019. EKG today shows NSR at 58 bpm Continue amiodarone 200 mg daily Last several Crs have been > 1.5. With age > 102 will decrease Eliquis to 2.5 mg BID.    CAD Denies s/s ischemia   HTN Stable on current regimen    CKD IV Follows with nephrology  Follow up with Dr. Elberta Fortis in 6 months  Signed, Graciella Freer, PA-C

## 2023-01-21 ENCOUNTER — Ambulatory Visit: Payer: Medicare Other | Attending: Student | Admitting: Student

## 2023-01-21 ENCOUNTER — Encounter: Payer: Self-pay | Admitting: Student

## 2023-01-21 VITALS — BP 150/60 | HR 58 | Ht 74.0 in | Wt 154.6 lb

## 2023-01-21 DIAGNOSIS — N184 Chronic kidney disease, stage 4 (severe): Secondary | ICD-10-CM | POA: Diagnosis not present

## 2023-01-21 DIAGNOSIS — I4819 Other persistent atrial fibrillation: Secondary | ICD-10-CM | POA: Diagnosis not present

## 2023-01-21 DIAGNOSIS — I2510 Atherosclerotic heart disease of native coronary artery without angina pectoris: Secondary | ICD-10-CM | POA: Diagnosis not present

## 2023-01-21 DIAGNOSIS — I484 Atypical atrial flutter: Secondary | ICD-10-CM | POA: Diagnosis not present

## 2023-01-21 DIAGNOSIS — I10 Essential (primary) hypertension: Secondary | ICD-10-CM

## 2023-01-21 MED ORDER — APIXABAN 2.5 MG PO TABS: 2.5000 mg | ORAL_TABLET | Freq: Two times a day (BID) | ORAL | 3 refills | Status: DC

## 2023-01-21 NOTE — Patient Instructions (Signed)
Medication Instructions:  Your physician has recommended you make the following change in your medication:  1.Decrease Eliquis to 2.5 mg twice daily  *If you need a refill on your cardiac medications before your next appointment, please call your pharmacy*  Lab Work: None ordered If you have labs (blood work) drawn today and your tests are completely normal, you will receive your results only by: MyChart Message (if you have MyChart) OR A paper copy in the mail If you have any lab test that is abnormal or we need to change your treatment, we will call you to review the results.  Follow-Up: At [facility], you and your health needs are our priority.  As part of our continuing mission to provide you with exceptional heart care, we have created designated Provider Care Teams.  These Care Teams include your primary Cardiologist (physician) and Advanced Practice Providers (APPs -  Physician Assistants and Nurse Practitioners) who all work together to provide you with the care you need, when you need it.  Your next appointment:   6 month(s)  Provider:   Loman Brooklyn, MD

## 2023-02-18 LAB — LAB REPORT - SCANNED: EGFR: 31

## 2023-05-12 ENCOUNTER — Telehealth (HOSPITAL_COMMUNITY): Payer: Self-pay | Admitting: *Deleted

## 2023-05-12 NOTE — Telephone Encounter (Signed)
Patient's daughter, [name], called in stating patient converted into Afib yesterday evening Heart rates in the 120s. BP 128/82. No missed anticoagulation confirmed. Discussed with Jorja Loa PA will increase amiodarone to 200mg  twice a day and call with update in 1 week. If still in afib at that point will bring into office for assessment and scheduling of cardioversion. Pt daughter verbalized agreement.

## 2023-05-17 ENCOUNTER — Emergency Department (HOSPITAL_COMMUNITY): Payer: Medicare Other

## 2023-05-17 ENCOUNTER — Other Ambulatory Visit: Payer: Self-pay

## 2023-05-17 ENCOUNTER — Observation Stay (HOSPITAL_COMMUNITY)
Admission: EM | Admit: 2023-05-17 | Discharge: 2023-05-18 | Disposition: A | Discharge status: Home / Self Care | Payer: Medicare Other | Attending: Internal Medicine | Admitting: Internal Medicine

## 2023-05-17 ENCOUNTER — Encounter (HOSPITAL_COMMUNITY): Payer: Self-pay

## 2023-05-17 DIAGNOSIS — Z79899 Other long term (current) drug therapy: Secondary | ICD-10-CM | POA: Diagnosis not present

## 2023-05-17 DIAGNOSIS — N184 Chronic kidney disease, stage 4 (severe): Secondary | ICD-10-CM | POA: Diagnosis not present

## 2023-05-17 DIAGNOSIS — J90 Pleural effusion, not elsewhere classified: Principal | ICD-10-CM | POA: Insufficient documentation

## 2023-05-17 DIAGNOSIS — I509 Heart failure, unspecified: Secondary | ICD-10-CM | POA: Insufficient documentation

## 2023-05-17 DIAGNOSIS — I2510 Atherosclerotic heart disease of native coronary artery without angina pectoris: Secondary | ICD-10-CM | POA: Insufficient documentation

## 2023-05-17 DIAGNOSIS — I4819 Other persistent atrial fibrillation: Secondary | ICD-10-CM | POA: Diagnosis not present

## 2023-05-17 DIAGNOSIS — Z7901 Long term (current) use of anticoagulants: Secondary | ICD-10-CM

## 2023-05-17 DIAGNOSIS — J9601 Acute respiratory failure with hypoxia: Secondary | ICD-10-CM | POA: Insufficient documentation

## 2023-05-17 DIAGNOSIS — J810 Acute pulmonary edema: Secondary | ICD-10-CM | POA: Diagnosis not present

## 2023-05-17 DIAGNOSIS — Z96651 Presence of right artificial knee joint: Secondary | ICD-10-CM | POA: Insufficient documentation

## 2023-05-17 DIAGNOSIS — Z951 Presence of aortocoronary bypass graft: Secondary | ICD-10-CM | POA: Diagnosis not present

## 2023-05-17 DIAGNOSIS — Z1152 Encounter for screening for COVID-19: Secondary | ICD-10-CM | POA: Diagnosis not present

## 2023-05-17 DIAGNOSIS — I130 Hypertensive heart and chronic kidney disease with heart failure and stage 1 through stage 4 chronic kidney disease, or unspecified chronic kidney disease: Secondary | ICD-10-CM | POA: Insufficient documentation

## 2023-05-17 DIAGNOSIS — E785 Hyperlipidemia, unspecified: Secondary | ICD-10-CM | POA: Diagnosis present

## 2023-05-17 DIAGNOSIS — Z87891 Personal history of nicotine dependence: Secondary | ICD-10-CM | POA: Insufficient documentation

## 2023-05-17 DIAGNOSIS — R0602 Shortness of breath: Secondary | ICD-10-CM | POA: Diagnosis present

## 2023-05-17 DIAGNOSIS — J439 Emphysema, unspecified: Secondary | ICD-10-CM | POA: Diagnosis present

## 2023-05-17 DIAGNOSIS — E039 Hypothyroidism, unspecified: Secondary | ICD-10-CM | POA: Insufficient documentation

## 2023-05-17 DIAGNOSIS — I480 Paroxysmal atrial fibrillation: Secondary | ICD-10-CM | POA: Diagnosis present

## 2023-05-17 DIAGNOSIS — Z9229 Personal history of other drug therapy: Secondary | ICD-10-CM

## 2023-05-17 DIAGNOSIS — I10 Essential (primary) hypertension: Secondary | ICD-10-CM | POA: Diagnosis present

## 2023-05-17 LAB — RESPIRATORY PANEL BY PCR

## 2023-05-17 LAB — BASIC METABOLIC PANEL
Anion gap: 11 (ref 5–15)
BUN: 25 mg/dL — ABNORMAL HIGH (ref 8–23)
CO2: 20 mmol/L — ABNORMAL LOW (ref 22–32)
Calcium: 8.1 mg/dL — ABNORMAL LOW (ref 8.9–10.3)
Chloride: 105 mmol/L (ref 98–111)
Creatinine, Ser: 1.43 mg/dL — ABNORMAL HIGH (ref 0.61–1.24)
GFR, Estimated: 47 mL/min — ABNORMAL LOW (ref >60)
Glucose, Bld: 101 mg/dL — ABNORMAL HIGH (ref 70–99)
Potassium: 4.1 mmol/L (ref 3.5–5.1)
Sodium: 136 mmol/L (ref 135–145)

## 2023-05-17 LAB — CBC
HCT: 35 % — ABNORMAL LOW (ref 39.0–52.0)
Hemoglobin: 11.4 g/dL — ABNORMAL LOW (ref 13.0–17.0)
MCH: 30.4 pg (ref 26.0–34.0)
MCHC: 32.6 g/dL (ref 30.0–36.0)
MCV: 93.3 fL (ref 80.0–100.0)
Platelets: 141 10*3/uL — ABNORMAL LOW (ref 150–400)
RBC: 3.75 MIL/uL — ABNORMAL LOW (ref 4.22–5.81)
RDW: 14.9 % (ref 11.5–15.5)
WBC: 11.5 10*3/uL — ABNORMAL HIGH (ref 4.0–10.5)
nRBC: 0 % (ref 0.0–0.2)

## 2023-05-17 LAB — C-REACTIVE PROTEIN: CRP: 8.8 mg/dL — ABNORMAL HIGH (ref <1.0)

## 2023-05-17 LAB — SARS CORONAVIRUS 2 BY RT PCR: SARS Coronavirus 2 by RT PCR: NEGATIVE

## 2023-05-17 LAB — BRAIN NATRIURETIC PEPTIDE: B Natriuretic Peptide: 413.7 pg/mL — ABNORMAL HIGH (ref 0.0–100.0)

## 2023-05-17 LAB — SEDIMENTATION RATE: Sed Rate: 30 mm/h — ABNORMAL HIGH (ref 0–16)

## 2023-05-17 LAB — HIV ANTIBODY (ROUTINE TESTING W REFLEX): HIV Screen 4th Generation wRfx: NONREACTIVE

## 2023-05-17 LAB — MAGNESIUM: Magnesium: 2.1 mg/dL (ref 1.7–2.4)

## 2023-05-17 LAB — MRSA NEXT GEN BY PCR, NASAL: MRSA by PCR Next Gen: NOT DETECTED

## 2023-05-17 LAB — TROPONIN I (HIGH SENSITIVITY)
Troponin I (High Sensitivity): 36 ng/L — ABNORMAL HIGH (ref <18)
Troponin I (High Sensitivity): 43 ng/L — ABNORMAL HIGH (ref <18)

## 2023-05-17 MED ORDER — VITAMIN D (ERGOCALCIFEROL) 1.25 MG (50000 UNIT) PO CAPS
50000.0000 [IU] | ORAL_CAPSULE | ORAL | Status: DC
  Administered 2023-05-17: 50000 [IU] via ORAL
  Filled 2023-05-17: qty 1

## 2023-05-17 MED ORDER — SODIUM CHLORIDE 0.9% FLUSH
3.0000 mL | Freq: Two times a day (BID) | INTRAVENOUS | Status: DC
  Administered 2023-05-17 (×2): 3 mL via INTRAVENOUS

## 2023-05-17 MED ORDER — TAMSULOSIN HCL 0.4 MG PO CAPS
0.4000 mg | ORAL_CAPSULE | Freq: Every day | ORAL | Status: DC
  Administered 2023-05-18: 0.4 mg via ORAL
  Filled 2023-05-17: qty 1

## 2023-05-17 MED ORDER — ACETAMINOPHEN 500 MG PO TABS: 500.0000 mg | ORAL_TABLET | Freq: Once | ORAL | Status: DC | PRN

## 2023-05-17 MED ORDER — FUROSEMIDE 10 MG/ML IJ SOLN
20.0000 mg | Freq: Once | INTRAMUSCULAR | Status: AC
  Administered 2023-05-17: 20 mg via INTRAVENOUS
  Filled 2023-05-17: qty 2

## 2023-05-17 MED ORDER — SODIUM CHLORIDE 0.9% FLUSH
3.0000 mL | Freq: Two times a day (BID) | INTRAVENOUS | Status: DC
Start: 2023-05-17 — End: 2023-05-17

## 2023-05-17 MED ORDER — DILTIAZEM HCL ER COATED BEADS 120 MG PO CP24
120.0000 mg | ORAL_CAPSULE | Freq: Every day | ORAL | Status: DC
  Administered 2023-05-17: 120 mg via ORAL
  Filled 2023-05-17: qty 1

## 2023-05-17 MED ORDER — ACETAMINOPHEN 325 MG PO TABS: 650.0000 mg | ORAL_TABLET | ORAL | Status: DC | PRN

## 2023-05-17 MED ORDER — ROSUVASTATIN CALCIUM 20 MG PO TABS
20.0000 mg | ORAL_TABLET | Freq: Every day | ORAL | Status: DC
  Administered 2023-05-17: 20 mg via ORAL
  Filled 2023-05-17: qty 1

## 2023-05-17 MED ORDER — APIXABAN 2.5 MG PO TABS: 2.5000 mg | ORAL_TABLET | Freq: Two times a day (BID) | ORAL | Status: DC

## 2023-05-17 MED ORDER — APIXABAN 2.5 MG PO TABS: 2.5000 mg | ORAL_TABLET | Freq: Once | ORAL | Status: DC

## 2023-05-17 MED ORDER — INFLUENZA VAC A&B SURF ANT ADJ 0.5 ML IM SUSY
0.5000 mL | PREFILLED_SYRINGE | INTRAMUSCULAR | Status: DC
  Filled 2023-05-17: qty 0.5

## 2023-05-17 MED ORDER — ALBUTEROL SULFATE HFA 108 (90 BASE) MCG/ACT IN AERS: 2.0000 | INHALATION_SPRAY | RESPIRATORY_TRACT | Status: DC | PRN

## 2023-05-17 MED ORDER — IPRATROPIUM-ALBUTEROL 0.5-2.5 (3) MG/3ML IN SOLN
3.0000 mL | Freq: Once | RESPIRATORY_TRACT | Status: AC
  Administered 2023-05-17: 3 mL via RESPIRATORY_TRACT
  Filled 2023-05-17: qty 3

## 2023-05-17 MED ORDER — LEVOTHYROXINE SODIUM 100 MCG PO TABS
100.0000 ug | ORAL_TABLET | Freq: Every day | ORAL | Status: DC
  Administered 2023-05-17: 100 ug via ORAL
  Filled 2023-05-17 (×2): qty 1

## 2023-05-17 MED ORDER — DILTIAZEM HCL ER 60 MG PO CP12: 120.0000 mg | ORAL_CAPSULE | Freq: Every day | ORAL | Status: DC

## 2023-05-17 MED ORDER — FUROSEMIDE 20 MG PO TABS
20.0000 mg | ORAL_TABLET | Freq: Every day | ORAL | Status: DC
  Administered 2023-05-17: 20 mg via ORAL
  Filled 2023-05-17: qty 1

## 2023-05-17 MED ORDER — SODIUM CHLORIDE 0.9% FLUSH
3.0000 mL | INTRAVENOUS | Status: DC | PRN
Start: 2023-05-17 — End: 2023-05-17

## 2023-05-17 MED ORDER — ONDANSETRON HCL 4 MG/2ML IJ SOLN: 4.0000 mg | Freq: Four times a day (QID) | INTRAMUSCULAR | Status: DC | PRN

## 2023-05-17 MED ORDER — SODIUM CHLORIDE 0.9% FLUSH: 3.0000 mL | INTRAVENOUS | Status: DC | PRN

## 2023-05-17 MED ORDER — SODIUM CHLORIDE 0.9 % IV SOLN: 250.0000 mL | INTRAVENOUS | Status: DC | PRN

## 2023-05-17 MED ORDER — LEVALBUTEROL HCL 0.63 MG/3ML IN NEBU: 0.6300 mg | INHALATION_SOLUTION | Freq: Four times a day (QID) | RESPIRATORY_TRACT | Status: DC | PRN

## 2023-05-17 MED ORDER — MOMETASONE FURO-FORMOTEROL FUM 200-5 MCG/ACT IN AERO
2.0000 | INHALATION_SPRAY | Freq: Two times a day (BID) | RESPIRATORY_TRACT | Status: DC
  Administered 2023-05-18: 2 via RESPIRATORY_TRACT
  Filled 2023-05-17: qty 8.8

## 2023-05-17 MED ORDER — APIXABAN 2.5 MG PO TABS
2.5000 mg | ORAL_TABLET | Freq: Two times a day (BID) | ORAL | Status: DC
  Administered 2023-05-17 – 2023-05-18 (×3): 2.5 mg via ORAL
  Filled 2023-05-17 (×3): qty 1

## 2023-05-17 MED ORDER — AMIODARONE HCL 200 MG PO TABS
200.0000 mg | ORAL_TABLET | Freq: Two times a day (BID) | ORAL | Status: DC
  Administered 2023-05-17 – 2023-05-18 (×3): 200 mg via ORAL
  Filled 2023-05-17 (×3): qty 1

## 2023-05-17 MED ORDER — APIXABAN 2.5 MG PO TABS: 2.5000 mg | ORAL_TABLET | ORAL | Status: DC
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
## 2023-05-17 NOTE — ED Notes (Signed)
Pt ambulated down the hall with a walker (baseline for Pt).  Pt's SPO2 remained 95% on RA. Dr. Theresia Lo informed via secure chat.
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
## 2023-05-18 ENCOUNTER — Inpatient Hospital Stay (HOSPITAL_BASED_OUTPATIENT_CLINIC_OR_DEPARTMENT_OTHER): Payer: Medicare Other

## 2023-05-18 DIAGNOSIS — I480 Paroxysmal atrial fibrillation: Secondary | ICD-10-CM | POA: Diagnosis present

## 2023-05-18 DIAGNOSIS — I5033 Acute on chronic diastolic (congestive) heart failure: Secondary | ICD-10-CM

## 2023-05-18 DIAGNOSIS — Q2112 Patent foramen ovale: Secondary | ICD-10-CM | POA: Insufficient documentation

## 2023-05-18 DIAGNOSIS — I4819 Other persistent atrial fibrillation: Secondary | ICD-10-CM | POA: Diagnosis not present

## 2023-05-18 LAB — ECHOCARDIOGRAM COMPLETE
AR max vel: 2.96 cm2
AV Area VTI: 3.05 cm2
AV Area mean vel: 2.91 cm2
AV Mean grad: 3 mm[Hg]
AV Peak grad: 5.7 mm[Hg]
Ao pk vel: 1.19 m/s
Area-P 1/2: 2.82 cm2
P 1/2 time: 495 ms
S' Lateral: 4 cm
Weight: 2264.57 [oz_av]

## 2023-05-18 LAB — BASIC METABOLIC PANEL
Anion gap: 13 (ref 5–15)
BUN: 30 mg/dL — ABNORMAL HIGH (ref 8–23)
CO2: 21 mmol/L — ABNORMAL LOW (ref 22–32)
Calcium: 8.2 mg/dL — ABNORMAL LOW (ref 8.9–10.3)
Chloride: 102 mmol/L (ref 98–111)
Creatinine, Ser: 1.81 mg/dL — ABNORMAL HIGH (ref 0.61–1.24)
GFR, Estimated: 36 mL/min — ABNORMAL LOW (ref >60)
Glucose, Bld: 79 mg/dL (ref 70–99)
Potassium: 4 mmol/L (ref 3.5–5.1)
Sodium: 136 mmol/L (ref 135–145)

## 2023-05-18 LAB — HEPATIC FUNCTION PANEL
ALT: 39 U/L (ref 0–44)
AST: 39 U/L (ref 15–41)
Albumin: 2.5 g/dL — ABNORMAL LOW (ref 3.5–5.0)
Alkaline Phosphatase: 94 U/L (ref 38–126)
Bilirubin, Direct: 0.1 mg/dL (ref 0.0–0.2)
Indirect Bilirubin: 0.5 mg/dL (ref 0.3–0.9)
Total Bilirubin: 0.6 mg/dL (ref 0.3–1.2)
Total Protein: 6.1 g/dL — ABNORMAL LOW (ref 6.5–8.1)

## 2023-05-18 LAB — BRAIN NATRIURETIC PEPTIDE: B Natriuretic Peptide: 940.4 pg/mL — ABNORMAL HIGH (ref 0.0–100.0)

## 2023-05-18 LAB — TSH: TSH: 2.791 u[IU]/mL (ref 0.350–4.500)

## 2023-05-18 MED ORDER — AMIODARONE HCL 200 MG PO TABS: 200.0000 mg | ORAL_TABLET | Freq: Two times a day (BID) | ORAL | 0 refills | Status: DC

## 2023-05-18 MED ORDER — FUROSEMIDE 20 MG PO TABS
20.0000 mg | ORAL_TABLET | ORAL | 0 refills | Status: DC
Start: 2023-05-18 — End: 2023-06-19

## 2023-05-18 NOTE — Progress Notes (Signed)
Heart Failure Navigator Progress Note  Assessed for Heart & Vascular TOC clinic readiness.  Patient EF 55-60%, admitted for Atrial fibrillation. Has a follow up appointment on 06/18/2023. .   Navigator available for reassessment of patient.   Rhae Hammock, BSN, Scientist, clinical (histocompatibility and immunogenetics) Only

## 2023-05-18 NOTE — Plan of Care (Signed)
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
## 2023-05-18 NOTE — Evaluation (Signed)
Occupational Therapy Evaluation Patient Details Name: Kurt Atkinson MRN: [id number] DOB: 1931/11/30 Today's Date: 05/18/2023   History of Present Illness Kurt Atkinson is a 89 y.o. male who presents with increasing SOB.  PMH: CAD s/p CABG in 2001, persistent afib on chronic anticoagulation with Eliquis, CKD, essential hypertension, hypothyroidism and emphysema   Clinical Impression   PTA, pt lived alone and was mod I for ADL. Upon eval, pt with generalized weakness, decreased strength, balance, safety. Pt reporting he feels much better but with good awareness overall of current abilities. Pt currently staying with daughter secondary to his home being in [place]. Pt and daughter both reporting they do not think he needs follow up OT. Will follow acutely.       If plan is discharge home, recommend the following: A little help with walking and/or transfers;A little help with bathing/dressing/bathroom;Assistance with cooking/housework;Assist for transportation;Help with stairs or ramp for entrance    Functional Status Assessment  Patient has had a recent decline in their functional status and demonstrates the ability to make significant improvements in function in a reasonable and predictable amount of time.  Equipment Recommendations  None recommended by OT    Recommendations for Other Services       Precautions / Restrictions Precautions Precautions: Fall Restrictions Weight Bearing Restrictions: No      Mobility Bed Mobility               General bed mobility comments: pt received sitting EOB    Transfers Overall transfer level: Needs assistance Equipment used: Rolling walker (2 wheels) Transfers: Sit to/from Stand Sit to Stand: Min assist           General transfer comment: minA to power up due to posterior bias, push off of front of chair with back of legs, verbal cues to increase anterior weight shift so weight is centered over feet upon standing to  minimize posterior bias and returning to sitting      Balance Overall balance assessment: History of Falls (per daughter pt has had some falls, most recent down the stairs)                                         ADL either performed or assessed with clinical judgement   ADL                                               Vision Patient Visual Report: No change from baseline       Perception Perception: Within Functional Limits       Praxis Praxis: [facility]       Pertinent Vitals/Pain Pain Assessment Pain Assessment: No/denies pain     Extremity/Trunk Assessment Upper Extremity Assessment Upper Extremity Assessment: Generalized weakness   Lower Extremity Assessment Lower Extremity Assessment: Generalized weakness   Cervical / Trunk Assessment Cervical / Trunk Assessment: Kyphotic (forward head)   Communication Communication Communication: No apparent difficulties   Cognition Arousal: Alert Behavior During Therapy: WFL for tasks assessed/performed Overall Cognitive Status: Within Functional Limits for tasks assessed                                 General Comments: pt with  good humor and motivation to mobilize     General Comments  VSS    Exercises     Shoulder Instructions      Home Living Family/patient expects to be discharged to:: Private residence Living Arrangements: Alone (plan to return to daughters house here in [place] initially) Available Help at Discharge: Family;Available 24 hours/day;Available PRN/intermittently;Friend(s) Type of Home: House Home Access: Stairs to enter [employer] of Steps: 2 (with no HR or 4 steps with a HR) Entrance Stairs-Rails: None Home Layout: Multi-level Alternate Level Stairs-Number of Steps: flight   Bathroom Shower/Tub: Tub/shower unit         Home Equipment: Agricultural consultant (2 wheels);Cane - single point;Shower seat;Grab bars - tub/shower;Toilet  riser;BSC/3in1          Prior Functioning/Environment Prior Level of Function : Independent/Modified Independent             Mobility Comments: used RW and SPC, drove ADLs Comments: family cooks and does mowing of the yardPt intermittently receives meals from family, but prepares his own meals when they do not cook        OT Problem List: Decreased strength;Impaired balance (sitting and/or standing);Decreased activity tolerance;Decreased knowledge of use of DME or AE      OT Treatment/Interventions: Self-care/ADL training;Therapeutic exercise;DME and/or AE instruction;Balance training;Patient/family education;Therapeutic activities    OT Goals(Current goals can be found in the care plan section) Acute Rehab OT Goals Patient Stated Goal: get better OT Goal Formulation: With patient Time For Goal Achievement: 06/01/23 Potential to Achieve Goals: Good  OT Frequency: Min 1X/week    Co-evaluation              AM-PAC OT "6 Clicks" Daily Activity     Outcome Measure Help from another person eating meals?: None Help from another person taking care of personal grooming?: A Little Help from another person toileting, which includes using toliet, bedpan, or urinal?: A Little Help from another person bathing (including washing, rinsing, drying)?: A Little Help from another person to put on and taking off regular upper body clothing?: A Little Help from another person to put on and taking off regular lower body clothing?: A Little 6 Click Score: 19   End of Session Equipment Utilized During Treatment: Gait belt Nurse Communication: Mobility status  Activity Tolerance: Patient tolerated treatment well Patient left: in chair;with nursing/sitter in room (NT came to get pt to dc)  OT Visit Diagnosis: Unsteadiness on feet (R26.81);Muscle weakness (generalized) (M62.81)                Time: 3086-5784 OT Time Calculation (min): 14 min Charges:  OT General Charges $OT Visit: 1  Visit OT Evaluation $OT Eval Low Complexity: 1 Low  Tyler Deis, OTR/L [facility] Acute Rehabilitation Office: [phone]   Myrla Halsted 05/18/2023, 1:02 PM

## 2023-05-18 NOTE — Plan of Care (Signed)

## 2023-05-18 NOTE — Progress Notes (Addendum)
Patient Name: Kurt Atkinson Date of Encounter: 05/18/2023  Primary Cardiologist: None Electrophysiologist: Dulcinea Kinser Jorja Loa, MD  Interval Summary   The patient is doing well today.  Diuresed 2.5L overnight. Converted to SR overnight.   At this time, the patient denies chest pain, shortness of breath, or any new concerns.  Vital Signs    Vitals:   05/17/23 1931 05/17/23 2300 05/18/23 0300 05/18/23 0518  BP: 111/62 132/66 (!) 148/61   Pulse: 76     Resp: 20 (!) 23 20   Temp: 97.6 F (36.4 C) 97.7 F (36.5 C) 97.6 F (36.4 C)   TempSrc: Oral Oral Oral   SpO2: 93% 97% 98%   Weight:    64.2 kg    Intake/Output Summary (Last 24 hours) at 05/18/2023 0646 Last data filed at 05/18/2023 0320 Gross per 24 hour  Intake --  Output 2575 ml  Net -2575 ml   Filed Weights   05/17/23 1730 05/18/23 0518  Weight: 65 kg 64.2 kg    Physical Exam    GEN- The patient is well appearing, alert and oriented x 3 today.   Lungs- Clear to ausculation bilaterally, normal work of breathing Cardiac- Regular rate and rhythm, no murmurs, rubs or gallops GI- soft, NT, ND, + BS Extremities- no clubbing or cyanosis. No edema  Telemetry    Converted to SR 60's (personally reviewed)  Hospital Course    Kurt Atkinson is a 89 y.o. 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   Patient reports that he went into atrial fibrillation on 05/12/2023. He has been feeling unwell that day and his daughter checked his heart rate and discovered atrial fibrillation with rates in the 120s.  Over the past 5 days he has developed worsening shortness of breath, dyspnea on exertion and new orthopnea.  Patient denies chest pain  or palpitations.  He reports that he been doing well prior to going into atrial fibrillation. CXR showed mild edema with fluid in fissure on lateral and small effusions. He was given IV lasix and diuresed 2.5L.  Overnight 10/7, he converted to SR 60's.    Assessment & Plan    Paroxsymal / Persistent Atrial Fibrillation  Hx PVI ablation & second of discrete focus (flutter) in 2019. Converted to SR 10/7. RVP negative.  Most likely volume contributing to dyspnea in setting of acute dCHF.   -continue amiodarone 200mg  BID for one month then back to 200 mg every day  -continue home diltiazem  -continue eliquis for stroke prevention  -hold lasix 10/7, would plan for 20 mg PO 3x weekly PRN swelling  Acute on Chronic Diastolic CHF  Hx CABG  -diuresis as above  -per primary   HTN  HLD  -per primary   CKD IV  Baseline Cr ~ 1.4-2 -per primary     [facility] Evany Schecter sign off.  Ok to discharge home from EP standpoint.   Medication Recommendations:  amiodarone, lasix as above Follow up as an outpatient:  Hospital f/u as arranged with EP  For questions or updates, please contact [facility] Please consult [email or web address] for contact info under Cardiology/STEMI.  Signed, Canary Brim, MSN, APRN, NP-C, AGACNP-BC [facility] - Electrophysiology  05/18/2023, 8:02 AM  I have seen and examined this patient with Canary Brim.  Agree with above, note added to reflect my findings.  He is feeling well.  He is converted to sinus rhythm.  GEN: Well nourished, well developed, in no acute distress  HEENT: normal  Neck: no JVD, carotid bruits, or masses Cardiac: RRR; no murmurs, rubs, or gallops,no edema  Respiratory:  clear to auscultation bilaterally, normal work of breathing GI: soft, nontender, nondistended, + BS MS: no deformity or atrophy  Skin: warm and dry Neuro:  Strength and sensation are intact Psych: euthymic mood, full affect   1.  Persistent atrial fibrillation:  Currently on amiodarone.  He is converted back to sinus rhythm.  Continue both amiodarone and Eliquis.  EP to sign off.  2.  Acute on chronic diastolic heart failure: Diuresis as above.  No longer short of breath.  3.  CKD stage IV: Plan per primary team  4.  Secondary hypercoagulable state: Currently on Eliquis for atrial fibrillation  [name] 05/18/2023 1:08 PM

## 2023-05-18 NOTE — Care Management Obs Status (Signed)
MEDICARE OBSERVATION STATUS NOTIFICATION   Patient Details  Name: CHAYANNE SPEIR MRN: [id number] Date of Birth: 01-23-1932   Medicare Observation Status Notification Given:  Yes    Ronny Bacon, RN 05/18/2023, 12:16 PM

## 2023-05-18 NOTE — Evaluation (Signed)
Physical Therapy Evaluation Patient Details Name: Kurt Atkinson MRN: [id number] DOB: 29-Sep-1931 Today's Date: 05/18/2023  History of Present Illness  Kurt Atkinson is a 89 y.o. male who presents with increasing SOB.  PMH: CAD s/p CABG in 2001, persistent afibon chronic anticoagulation with Eliquis, CKD, essential hypertension, hypothyroidism and emphysema   Clinical Impression  Pt admitted with above. Pt presenting with mild deconditioning and decreased activity tolerance compared to baseline. Pt typically lives alone in [place] however is currently staying with daughter in [place] due to [place]. 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 I don't anticipate pt needing follow up PT upon d/c however may benefit from outpt PT in the future to improve overall strength and balance as pt lives independently. Acute PT to cont to follow while in hospital and mobility team will also be consulted to increased amb freq.        If plan is discharge home, recommend the following: A little help with walking and/or transfers;A little help with bathing/dressing/bathroom;Assistance with cooking/housework;Assist for transportation;Help with stairs or ramp for entrance   Can travel by private vehicle        Equipment Recommendations None recommended by PT  Recommendations for Other Services       Functional Status Assessment Patient has had a recent decline in their functional status and demonstrates the ability to make significant improvements in function in a reasonable and predictable amount of time.     Precautions / Restrictions Precautions Precautions: Fall Restrictions Weight Bearing Restrictions: No      Mobility  Bed Mobility               General bed mobility comments: pt received sitting up in chair    Transfers Overall transfer level: Needs assistance Equipment used:  Rolling walker (2 wheels) Transfers: Sit to/from Stand Sit to Stand: Min assist           General transfer comment: minA to power up due to posterior bias, push off of front of chair with back of legs, verbal cues to increase anterior weight shift so weight is centered over feet upon standing to minimize posterior bias and returning to sitting    Ambulation/Gait Ambulation/Gait assistance: Contact guard assist Gait Distance (Feet): 150 Feet Assistive device: Rolling walker (2 wheels) Gait Pattern/deviations: Step-through pattern, Decreased stride length, Trunk flexed Gait velocity: dec Gait velocity interpretation: >2.62 ft/sec, indicative of community ambulatory   General Gait Details: pt with forward head, verbal cues to retract shld blades to achieve full upright posture, with onset of fatigue pt with decreased step height and length, near shuffling pattern, verbal cues to stay in center of walker with hands in center of grip vs behind walker  Stairs            Wheelchair Mobility     Tilt Bed    Modified Rankin (Stroke Patients Only)       Balance Overall balance assessment: History of Falls (per daughter pt has had some falls, most recent down the stairs)                                           Pertinent Vitals/Pain Pain Assessment Pain Assessment: No/denies pain    Home Living Family/patient expects to be discharged to::  Private residence Living Arrangements: Alone (plan to return to daughters house here in [place] initially) Available Help at Discharge: Family;Available 24 hours/day;Available PRN/intermittently;Friend(s) Type of Home: House Home Access: Stairs to enter Entrance Stairs-Rails: None Entrance Stairs-Number of Steps: 2 (with no HR or 4 steps with a HR) Alternate Level Stairs-Number of Steps: flight Home Layout: Multi-level Home Equipment: Agricultural consultant (2 wheels);Cane - single point      Prior Function Prior Level of  Function : Independent/Modified Independent             Mobility Comments: used RW and SPC, drove ADLs Comments: family cooks and does mowing of the yard     Extremity/Trunk Assessment   Upper Extremity Assessment Upper Extremity Assessment: Generalized weakness    Lower Extremity Assessment Lower Extremity Assessment: Generalized weakness    Cervical / Trunk Assessment Cervical / Trunk Assessment: Kyphotic (forward head)  Communication   Communication Communication: No apparent difficulties  Cognition Arousal: Alert Behavior During Therapy: WFL for tasks assessed/performed Overall Cognitive Status: Within Functional Limits for tasks assessed                                 General Comments: pt with good humor and motivation to mobilize        General Comments General comments (skin integrity, edema, etc.): VSS    Exercises General Exercises - Lower Extremity Ankle Circles/Pumps: AROM, Both, 10 reps, Seated Long Arc Quad: AROM, Both, 5 reps, Seated (with 5 sec hold at end) Hip Flexion/Marching: AROM, Both, 10 reps, Seated   Assessment/Plan    PT Assessment Patient needs continued PT services  PT Problem List Decreased strength;Decreased range of motion;Decreased activity tolerance;Decreased balance;Decreased mobility       PT Treatment Interventions DME instruction;Gait training;Stair training;Functional mobility training;Therapeutic activities;Therapeutic exercise;Balance training    PT Goals (Current goals can be found in the Care Plan section)  Acute Rehab PT Goals Patient Stated Goal: home today PT Goal Formulation: With patient/family Time For Goal Achievement: 06/01/23 Potential to Achieve Goals: Good    Frequency Min 1X/week     Co-evaluation               AM-PAC PT "6 Clicks" Mobility  Outcome Measure Help needed turning from your back to your side while in a flat bed without using bedrails?: A Little Help needed moving  from lying on your back to sitting on the side of a flat bed without using bedrails?: A Little Help needed moving to and from a bed to a chair (including a wheelchair)?: A Little Help needed standing up from a chair using your arms (e.g., wheelchair or bedside chair)?: A Little Help needed to walk in hospital room?: A Little Help needed climbing 3-5 steps with a railing? : A Little 6 Click Score: 18    End of Session Equipment Utilized During Treatment: Gait belt Activity Tolerance: Patient tolerated treatment well Patient left: in chair;with call bell/phone within reach;with family/visitor present Nurse Communication: Mobility status PT Visit Diagnosis: Unsteadiness on feet (R26.81);Muscle weakness (generalized) (M62.81);Difficulty in walking, not elsewhere classified (R26.2)    Time: 1610-9604 PT Time Calculation (min) (ACUTE ONLY): 43 min   Charges:   PT Evaluation $PT Eval Moderate Complexity: 1 Mod PT Treatments $Gait Training: 8-22 mins PT General Charges $$ ACUTE PT VISIT: 1 Visit         Lewis Shock, PT, DPT Acute Rehabilitation Services Secure chat preferred Office #:  [phone]   Gerica Koble M Omie Ferger 05/18/2023, 11:35 AM

## 2023-05-18 NOTE — Care Management CC44 (Signed)
Condition Code 44 Documentation Completed  Patient Details  Name: Kurt Atkinson MRN: [id number] Date of Birth: 1932/07/17   Condition Code 44 given:  Yes Patient signature on Condition Code 44 notice:  Yes Documentation of 2 MD's agreement:  Yes Code 44 added to claim:  Yes    Ronny Bacon, RN 05/18/2023, 12:16 PM

## 2023-05-18 NOTE — Progress Notes (Signed)
Went over discharge instructions with patient and daughter. All questions answered. PIV and telemetry removed. All belongings at bedside.

## 2023-05-20 ENCOUNTER — Other Ambulatory Visit (HOSPITAL_COMMUNITY): Payer: Self-pay | Admitting: *Deleted

## 2023-05-20 MED ORDER — AMIODARONE HCL 200 MG PO TABS: 200.0000 mg | ORAL_TABLET | Freq: Two times a day (BID) | ORAL | 1 refills | Status: DC

## 2023-05-22 ENCOUNTER — Other Ambulatory Visit (HOSPITAL_COMMUNITY): Payer: Self-pay | Admitting: *Deleted

## 2023-05-22 MED ORDER — AMIODARONE HCL 200 MG PO TABS: ORAL_TABLET | ORAL | 1 refills | Status: DC

## 2023-06-02 ENCOUNTER — Other Ambulatory Visit: Payer: Self-pay | Admitting: Cardiology

## 2023-06-08 ENCOUNTER — Emergency Department (HOSPITAL_COMMUNITY): Payer: Medicare Other

## 2023-06-08 ENCOUNTER — Other Ambulatory Visit: Payer: Self-pay

## 2023-06-08 ENCOUNTER — Emergency Department (HOSPITAL_COMMUNITY)
Admission: EM | Admit: 2023-06-08 | Discharge: 2023-06-10 | Disposition: A | Discharge status: Home / Self Care | Payer: Medicare Other | Attending: Emergency Medicine | Admitting: Emergency Medicine

## 2023-06-08 ENCOUNTER — Encounter (HOSPITAL_COMMUNITY): Payer: Self-pay

## 2023-06-08 DIAGNOSIS — Z7901 Long term (current) use of anticoagulants: Secondary | ICD-10-CM | POA: Diagnosis not present

## 2023-06-08 DIAGNOSIS — R531 Weakness: Secondary | ICD-10-CM | POA: Diagnosis not present

## 2023-06-08 DIAGNOSIS — Z23 Encounter for immunization: Secondary | ICD-10-CM | POA: Insufficient documentation

## 2023-06-08 DIAGNOSIS — I509 Heart failure, unspecified: Secondary | ICD-10-CM | POA: Diagnosis not present

## 2023-06-08 DIAGNOSIS — E1122 Type 2 diabetes mellitus with diabetic chronic kidney disease: Secondary | ICD-10-CM | POA: Insufficient documentation

## 2023-06-08 DIAGNOSIS — M25561 Pain in right knee: Secondary | ICD-10-CM | POA: Insufficient documentation

## 2023-06-08 DIAGNOSIS — R072 Precordial pain: Secondary | ICD-10-CM | POA: Insufficient documentation

## 2023-06-08 DIAGNOSIS — I447 Left bundle-branch block, unspecified: Secondary | ICD-10-CM | POA: Insufficient documentation

## 2023-06-08 DIAGNOSIS — M25451 Effusion, right hip: Secondary | ICD-10-CM | POA: Diagnosis not present

## 2023-06-08 DIAGNOSIS — I130 Hypertensive heart and chronic kidney disease with heart failure and stage 1 through stage 4 chronic kidney disease, or unspecified chronic kidney disease: Secondary | ICD-10-CM | POA: Diagnosis not present

## 2023-06-08 DIAGNOSIS — Z951 Presence of aortocoronary bypass graft: Secondary | ICD-10-CM | POA: Diagnosis not present

## 2023-06-08 DIAGNOSIS — Z79899 Other long term (current) drug therapy: Secondary | ICD-10-CM | POA: Diagnosis not present

## 2023-06-08 DIAGNOSIS — I2510 Atherosclerotic heart disease of native coronary artery without angina pectoris: Secondary | ICD-10-CM | POA: Insufficient documentation

## 2023-06-08 DIAGNOSIS — I129 Hypertensive chronic kidney disease with stage 1 through stage 4 chronic kidney disease, or unspecified chronic kidney disease: Secondary | ICD-10-CM | POA: Insufficient documentation

## 2023-06-08 DIAGNOSIS — M25551 Pain in right hip: Secondary | ICD-10-CM | POA: Diagnosis present

## 2023-06-08 DIAGNOSIS — N184 Chronic kidney disease, stage 4 (severe): Secondary | ICD-10-CM | POA: Diagnosis not present

## 2023-06-08 DIAGNOSIS — W19XXXA Unspecified fall, initial encounter: Secondary | ICD-10-CM | POA: Insufficient documentation

## 2023-06-08 DIAGNOSIS — I4891 Unspecified atrial fibrillation: Secondary | ICD-10-CM | POA: Insufficient documentation

## 2023-06-08 LAB — URINALYSIS, ROUTINE W REFLEX MICROSCOPIC
Bilirubin Urine: NEGATIVE
Glucose, UA: NEGATIVE mg/dL
Hgb urine dipstick: NEGATIVE
Ketones, ur: NEGATIVE mg/dL
Leukocytes,Ua: NEGATIVE
Nitrite: NEGATIVE
Protein, ur: NEGATIVE mg/dL
Specific Gravity, Urine: 1.02 (ref 1.005–1.030)
pH: 5 (ref 5.0–8.0)

## 2023-06-08 LAB — HEPATIC FUNCTION PANEL
ALT: 43 U/L (ref 0–44)
AST: 46 U/L — ABNORMAL HIGH (ref 15–41)
Albumin: 2.3 g/dL — ABNORMAL LOW (ref 3.5–5.0)
Alkaline Phosphatase: 91 U/L (ref 38–126)
Bilirubin, Direct: 0.2 mg/dL (ref 0.0–0.2)
Indirect Bilirubin: 0.6 mg/dL (ref 0.3–0.9)
Total Bilirubin: 0.8 mg/dL (ref 0.3–1.2)
Total Protein: 5.9 g/dL — ABNORMAL LOW (ref 6.5–8.1)

## 2023-06-08 LAB — CBC
HCT: 32.1 % — ABNORMAL LOW (ref 39.0–52.0)
Hemoglobin: 10.2 g/dL — ABNORMAL LOW (ref 13.0–17.0)
MCH: 29.6 pg (ref 26.0–34.0)
MCHC: 31.8 g/dL (ref 30.0–36.0)
MCV: 93 fL (ref 80.0–100.0)
Platelets: 158 10*3/uL (ref 150–400)
RBC: 3.45 MIL/uL — ABNORMAL LOW (ref 4.22–5.81)
RDW: 15 % (ref 11.5–15.5)
WBC: 9.8 10*3/uL (ref 4.0–10.5)
nRBC: 0 % (ref 0.0–0.2)

## 2023-06-08 LAB — BASIC METABOLIC PANEL
Anion gap: 6 (ref 5–15)
BUN: 35 mg/dL — ABNORMAL HIGH (ref 8–23)
CO2: 21 mmol/L — ABNORMAL LOW (ref 22–32)
Calcium: 7.9 mg/dL — ABNORMAL LOW (ref 8.9–10.3)
Chloride: 112 mmol/L — ABNORMAL HIGH (ref 98–111)
Creatinine, Ser: 1.49 mg/dL — ABNORMAL HIGH (ref 0.61–1.24)
GFR, Estimated: 45 mL/min — ABNORMAL LOW (ref >60)
Glucose, Bld: 93 mg/dL (ref 70–99)
Potassium: 4 mmol/L (ref 3.5–5.1)
Sodium: 139 mmol/L (ref 135–145)

## 2023-06-08 LAB — CBG MONITORING, ED: Glucose-Capillary: 85 mg/dL (ref 70–99)

## 2023-06-08 LAB — POC OCCULT BLOOD, ED: Fecal Occult Bld: NEGATIVE

## 2023-06-08 LAB — TROPONIN I (HIGH SENSITIVITY): Troponin I (High Sensitivity): 36 ng/L — ABNORMAL HIGH (ref <18)

## 2023-06-08 LAB — BRAIN NATRIURETIC PEPTIDE: B Natriuretic Peptide: 405.3 pg/mL — ABNORMAL HIGH (ref 0.0–100.0)

## 2023-06-08 MED ORDER — INFLUENZA VAC A&B SURF ANT ADJ 0.5 ML IM SUSY
0.5000 mL | PREFILLED_SYRINGE | INTRAMUSCULAR | Status: AC
  Administered 2023-06-09: 0.5 mL via INTRAMUSCULAR
  Filled 2023-06-08: qty 0.5

## 2023-06-08 NOTE — ED Notes (Signed)
Generalized weakness severe. Unable to sit on on without support. Unable to stand. Daughter at bedside. Provider informed unable to do orthostatics.

## 2023-06-08 NOTE — ED Provider Notes (Signed)
[place] [facility]MENT AT [facility] Provider Note   CSN: [id number] Arrival date & time: 06/08/23  1914     History  Chief Complaint  Patient presents with   Kurt Atkinson is a 89 y.o. male.  Patient with h/o persistent A-fib on Eliquis, remote cardioversion, prior ablation in 2019 with recurrence, CAD s/p CABG 2001, HTN, HLD, CKD stage 4, hypothyroidism, emphysema --presents to the emergency department with family today for evaluation of weakness.  After hospitalization 3 weeks ago for atrial fibrillation, patient was doing better.  During hospital stay amiodarone dose was increased to 200mg  twice daily.  They note that he has been very tired and weak over the past several days.  He has had some falls at home, most recently 4 days ago.  States that his legs just gave out.  He has had increased right knee pain and difficulty with ambulation as well as left rib pain.  During these falls, patient denies hitting his head and denies neck pain.  Typically he is able to ambulate with a cane.  Now he needs significant help even getting up out of the bed.  No chest pains or shortness of breath.  No cough or fever.  No urinary symptoms.  Family wonders if symptoms could be related to increased dose of amiodarone.       Home Medications Prior to Admission medications   Medication Sig Start Date End Date Taking? Authorizing Provider  acetaminophen (TYLENOL) 500 MG tablet Take 500 mg by mouth once as needed for moderate pain.   Yes [provider]  amiodarone (PACERONE) 200 MG tablet Take 1 tablet (200 mg total) by mouth 2 (two) times daily for 30 days, THEN 1 tablet (200 mg total) daily. 05/22/23 06/20/24 Yes Fenton, Clint R, PA  apixaban (ELIQUIS) 2.5 MG TABS tablet Take 1 tablet (2.5 mg total) by mouth 2 (two) times daily. 01/21/23  Yes Graciella Freer, PA-C  diltiazem (CARDIZEM SR) 120 MG 12 hr capsule TAKE 1 CAPSULE BY MOUTH DAILY 06/04/23  Yes  Graciella Freer, PA-C  furosemide (LASIX) 20 MG tablet Take 1 tablet (20 mg total) by mouth 3 (three) times a week. 05/18/23 06/17/23 Yes Dahal, Melina Schools, MD  levothyroxine (SYNTHROID) 100 MCG tablet Take 100 mcg by mouth daily. 04/09/19  Yes [provider]  primidone (MYSOLINE) 50 MG tablet Take 50 mg by mouth at bedtime. 05/22/23  Yes [provider]  rosuvastatin (CRESTOR) 20 MG tablet Take 20 mg by mouth at bedtime. 12/09/21  Yes [provider]  tamsulosin (FLOMAX) 0.4 MG CAPS capsule Take 0.4 mg by mouth daily.  12/03/16  Yes [provider]  Vitamin D, Ergocalciferol, (DRISDOL) 1.25 MG (50000 UNIT) CAPS capsule Take 1 capsule by mouth once a week. 01/07/22  Yes [provider]      Allergies    Penicillins and Shrimp [shellfish allergy]    Review of Systems   Review of Systems  Physical Exam Updated Vital Signs BP (!) 135/49 (BP Location: Left Arm)   Pulse 66   Temp 98.2 F (36.8 C) (Oral)   Resp (!) 8   Ht 6\' 2"  (1.88 m)   Wt 64.9 kg   SpO2 98%   BMI 18.36 kg/m   Physical Exam Vitals and nursing note reviewed. Exam conducted with a chaperone present.  Constitutional:      General: He is not in acute distress.    Appearance: He is well-developed.  HENT:     Head: Normocephalic and atraumatic.     Nose: Nose normal.     Mouth/Throat:     Mouth: Mucous membranes are moist.  Eyes:     General:        Right eye: No discharge.        Left eye: No discharge.     Conjunctiva/sclera: Conjunctivae normal.  Cardiovascular:     Rate and Rhythm: Normal rate and regular rhythm.     Heart sounds: Normal heart sounds.  Pulmonary:     Effort: Pulmonary effort is normal.     Breath sounds: Normal breath sounds.  Abdominal:     Palpations: Abdomen is soft.     Tenderness: There is no abdominal tenderness. There is no guarding or rebound.  Genitourinary:    Rectum: No tenderness, external hemorrhoid or internal hemorrhoid.      Comments: Tan stool, no red blood or melena. Musculoskeletal:     Cervical back: Normal range of motion and neck supple.  Skin:    General: Skin is warm and dry.  Neurological:     Mental Status: He is alert.     ED Results / Procedures / Treatments   Labs (all labs ordered are listed, but only abnormal results are displayed) Labs Reviewed  BASIC METABOLIC PANEL - Abnormal; Notable for the following components:      Result Value   Chloride 112 (*)    CO2 21 (*)    BUN 35 (*)    Creatinine, Ser 1.49 (*)    Calcium 7.9 (*)    GFR, Estimated 45 (*)    All other components within normal limits  CBC - Abnormal; Notable for the following components:   RBC 3.45 (*)    Hemoglobin 10.2 (*)    HCT 32.1 (*)    All other components within normal limits  HEPATIC FUNCTION PANEL - Abnormal; Notable for the following components:   Total Protein 5.9 (*)    Albumin 2.3 (*)    AST 46 (*)    All other components within normal limits  BRAIN NATRIURETIC PEPTIDE - Abnormal; Notable for the following components:   B Natriuretic Peptide 405.3 (*)    All other components within normal limits  TROPONIN I (HIGH SENSITIVITY) - Abnormal; Notable for the following components:   Troponin I (High Sensitivity) 36 (*)    All other components within normal limits  URINALYSIS, ROUTINE W REFLEX MICROSCOPIC  AMIODARONE LEVEL  CBG MONITORING, ED  POC OCCULT BLOOD, ED    EKG EKG Interpretation Date/Time:  Monday June 08 2023 09:57:33 EDT Ventricular Rate:  64 PR Interval:    QRS Duration:  118 QT Interval:  460 QTC Calculation: 475 R Axis:   -13  Text Interpretation: AV dissociation Incomplete left bundle branch block Borderline low voltage, extremity leads Confirmed by Coralee Pesa [phone]) on 06/08/2023 1:22:51 PM  Radiology CT Head Wo Contrast  Result Date: 06/08/2023 CLINICAL DATA:  Head trauma, minor (Age >= 65y); Neck trauma (Age >= 65y) EXAM: CT HEAD WITHOUT CONTRAST CT CERVICAL SPINE  WITHOUT CONTRAST TECHNIQUE: Multidetector CT imaging of the head and cervical spine was performed following the standard protocol without intravenous contrast. Multiplanar CT image reconstructions of the cervical spine were also generated. RADIATION DOSE REDUCTION: This exam was performed according to the departmental dose-optimization program which includes automated exposure control, adjustment of the mA and/or kV according to patient size and/or use of iterative reconstruction technique. COMPARISON:  None Available.  FINDINGS: CT HEAD FINDINGS Brain: No evidence of acute infarction, hemorrhage, hydrocephalus, extra-axial collection or mass lesion/mass effect. Sequela of mild chronic microvascular ischemic change. Vascular: No hyperdense vessel or unexpected calcification. Skull: Normal. Negative for fracture or focal lesion. Sinuses/Orbits: No middle ear or mastoid effusion. Paranasal sinuses are clear. Orbits are unremarkable. Other: None. CT CERVICAL SPINE FINDINGS Alignment: Trace retrolisthesis of C2 on C3 and C3 on C4. Skull base and vertebrae: No acute fracture. No primary bone lesion or focal pathologic process. Soft tissues and spinal canal: No prevertebral fluid or swelling. No visible canal hematoma. Disc levels:  No CT evidence of high-grade spinal canal stenosis Upper chest: Small right pleural effusion Other: None IMPRESSION: 1. No acute intracranial abnormality. 2. No acute fracture or traumatic subluxation of the cervical spine. 3. Small right pleural effusion. Electronically Signed   By: Lorenza Cambridge M.D.   On: 06/08/2023 14:38   CT Cervical Spine Wo Contrast  Result Date: 06/08/2023 CLINICAL DATA:  Head trauma, minor (Age >= 65y); Neck trauma (Age >= 65y) EXAM: CT HEAD WITHOUT CONTRAST CT CERVICAL SPINE WITHOUT CONTRAST TECHNIQUE: Multidetector CT imaging of the head and cervical spine was performed following the standard protocol without intravenous contrast. Multiplanar CT image  reconstructions of the cervical spine were also generated. RADIATION DOSE REDUCTION: This exam was performed according to the departmental dose-optimization program which includes automated exposure control, adjustment of the mA and/or kV according to patient size and/or use of iterative reconstruction technique. COMPARISON:  None Available. FINDINGS: CT HEAD FINDINGS Brain: No evidence of acute infarction, hemorrhage, hydrocephalus, extra-axial collection or mass lesion/mass effect. Sequela of mild chronic microvascular ischemic change. Vascular: No hyperdense vessel or unexpected calcification. Skull: Normal. Negative for fracture or focal lesion. Sinuses/Orbits: No middle ear or mastoid effusion. Paranasal sinuses are clear. Orbits are unremarkable. Other: None. CT CERVICAL SPINE FINDINGS Alignment: Trace retrolisthesis of C2 on C3 and C3 on C4. Skull base and vertebrae: No acute fracture. No primary bone lesion or focal pathologic process. Soft tissues and spinal canal: No prevertebral fluid or swelling. No visible canal hematoma. Disc levels:  No CT evidence of high-grade spinal canal stenosis Upper chest: Small right pleural effusion Other: None IMPRESSION: 1. No acute intracranial abnormality. 2. No acute fracture or traumatic subluxation of the cervical spine. 3. Small right pleural effusion. Electronically Signed   By: Lorenza Cambridge M.D.   On: 06/08/2023 14:38    Procedures Procedures    Medications Ordered in ED Medications - No data to display  ED Course/ Medical Decision Making/ A&P Clinical Course as of 06/09/23 0718  Mon Jun 08, 2023  1526 POC occult blood, ED Provider will collect [ME]  1556 Family unable to care for at home 2/2 genralized weakness. F/u UA for UTI, otherwise admit for PT/OT/dispo [GD]  1625 No UTI [GD]    Clinical Course User Index [GD] Karmen Stabs, MD [ME] Haywood Filler D    Patient seen and examined. History obtained directly from patient and family member  at bedside. I have also reviewed recent hospitalization note.   Labs/EKG: Ordered CBC, BMP and hepatic function pane, troponin, UA, amiodarone level, BNP.  Imaging: Ordered CT head and C-spine, x-ray of the chest and left ribs, x-ray of the knee.  Medications/Fluids: None ordered  Most recent vital signs reviewed and are as follows: BP (!) 152/56   Pulse 65   Temp 98.3 F (36.8 C) (Oral)   Resp (!) 22   Ht 6\' 2"  (1.88 m)  Wt 64.9 kg   SpO2 98%   BMI 18.36 kg/m   Initial impression: Generalized weakness  3:14 PM Reassessment performed. Patient appears stable, comfortable.  Currently has condom cath on.  Labs personally reviewed and interpreted including: CBC with white blood cell count of 9.8, hemoglobin slightly lower than previous at 10.2; BMP with creatinine near baseline at 1.49, BUN 35, elevated chloride at 112 otherwise normal potassium and sodium and glucose; hepatic function panel with low protein, AST minimally elevated at 46 otherwise unremarkable; troponin 36 consistent with previous likely related to CKD; BNP elevated at 400.  Imaging personally visualized and interpreted including: CT head and cervical spine negative for acute findings, there is a small right pleural effusion noted.  Plain film x-rays pending.  Reviewed pertinent lab work and imaging with patient at bedside. Questions answered.   Most current vital signs reviewed and are as follows: BP (!) 152/56   Pulse 65   Temp 98.3 F (36.8 C) (Oral)   Resp (!) 22   Ht 6\' 2"  (1.88 m)   Wt 64.9 kg   SpO2 98%   BMI 18.36 kg/m   Plan: Hemoccult performed with chaperone, not grossly bloody.   Awaiting UA, orthostatics, x-ray results.   3:30 PM Signout to oncoming team at shift change. Will need reassessment and dispo decision. He cannot likely safely be discharged 2/2 to profound weakness.                                 Medical Decision Making Amount and/or Complexity of Data Reviewed Labs:  ordered. Radiology: ordered.  Risk Prescription drug management.   Patient with generalized weakness, frequent falls, significant decrease in baseline function.        Final Clinical Impression(s) / ED Diagnoses Final diagnoses:  Generalized weakness    Rx / DC Orders ED Discharge Orders     None         Renne Crigler, PA-C 06/09/23 0719    Rozelle Logan, DO 06/17/23 1632

## 2023-06-08 NOTE — ED Provider Notes (Signed)
  Physical Exam  BP (!) 152/56   Pulse 65   Temp 98.3 F (36.8 C) (Oral)   Resp (!) 22   Ht 6\' 2"  (1.88 m)   Wt 64.9 kg   SpO2 98%   BMI 18.36 kg/m   Physical Exam see MDM below  Procedures  Procedures  ED Course / MDM   Clinical Course as of 06/08/23 1702  Mon Jun 08, 2023  1526 POC occult blood, ED Provider will collect [ME]  1556 Family unable to care for at home 2/2 genralized weakness. F/u UA for UTI, otherwise admit for PT/OT/dispo [GD]  1625 No UTI [GD]    Clinical Course User Index [GD] Karmen Stabs, MD [ME] Haywood Filler D   Medical Decision Making Amount and/or Complexity of Data Reviewed Labs: ordered. Radiology: ordered.  Risk Prescription drug management.   h/o persistent A-fib on Eliquis, remote cardioversion, prior ablation in 2019 with recurrence, CAD s/p CABG 2001, HTN, HLD, CKD stage 4, hypothyroidism, emphysema --presents to the emergency department with family today for evaluation of weakness. On amiodarone. Was doing well post hospitalization but past week unable to ambulate with cane, generalized weakness, 2 falls last week. Ct head and neck unremarkable. Labs with stable renal function, CBC, trops, negative FOBT. Pending a UA. Plan is f/u UA, admit for UTI if UTI, otherwise PT/OT/placement admit.   I reviewed the patient's labs as above and his imaging which has included CAT scans of the head and C-spine that are unremarkable, rib x-ray of the left side and right knee x-ray which are unremarkable.  I reassessed the patient.  His UA was obtained and resulted with no UTI. At this time he reports his right leg is very painful when he tries to walk on it (though was still globally weak and having issues before the fall). I discussed findings and workup with family who do not feel comfortable taking patient home given he cannot walk or stand. He was unable to stand for orthostatic vitals in the ED.   I discussed the patient with hospitalist for  admission for PT OT and placement.  Given there is no medical reason for the admission he was declined for admission.  Discussed this with the family who is agreeable to stay in ED overnight for PT OT and case management/social work consult in the morning.  I ordered an MRI brain to evaluate for any abnormalities or strokes missed by his CT, or any other etiology that would explain his global weakness. This was pending a read at time of shift change. I ordered a pelvic and R hip X ray out no concern for occult hip fracture given pain with ambulation on right lower extremity.  Radiologist called possible nondisplaced fracture on x-ray with recommendation for CT to follow-up.  CT hip was ordered and pending a read a time of shift change.   Care was handed to oncoming APP.  Please see her note for further details.  Plan is to follow-up imaging, if hip fracture seen or any abnormality on brain MRI then consult appropriate service and admit; if no acute pathology found then patient will wait until the morning to get PT OT and transitions of care consult.           Karmen Stabs, MD 06/08/23 2323    Melene Plan, DO 06/08/23 2327

## 2023-06-08 NOTE — ED Notes (Signed)
Patient transported to MRI 

## 2023-06-08 NOTE — ED Notes (Signed)
Pts condom catheter had fallen off and pt urinated in the bed. Pt was cleaned up, chuck pads and linen replaced and new condom placed on pt. Pt in nad and resting comfortably at this time.

## 2023-06-08 NOTE — ED Triage Notes (Signed)
Pt came to ED for an unmwitnessed fall. Pt fell on Thursday. Pt states he hit right knee and left sided ribs on chair. Pt denies hitting head and did not have LOC. Pt is on eliquis. C/O weakness since fall. axox4

## 2023-06-09 MED ORDER — TAMSULOSIN HCL 0.4 MG PO CAPS
0.4000 mg | ORAL_CAPSULE | Freq: Every day | ORAL | Status: DC
  Administered 2023-06-10: 0.4 mg via ORAL
  Filled 2023-06-09 (×2): qty 1

## 2023-06-09 MED ORDER — DILTIAZEM HCL ER 60 MG PO CP12
120.0000 mg | ORAL_CAPSULE | Freq: Every day | ORAL | Status: DC
  Filled 2023-06-09 (×2): qty 2

## 2023-06-09 MED ORDER — LEVOTHYROXINE SODIUM 100 MCG PO TABS
100.0000 ug | ORAL_TABLET | Freq: Every day | ORAL | Status: DC
  Administered 2023-06-09 – 2023-06-10 (×2): 100 ug via ORAL
  Filled 2023-06-09 (×2): qty 1

## 2023-06-09 MED ORDER — APIXABAN 2.5 MG PO TABS
2.5000 mg | ORAL_TABLET | Freq: Two times a day (BID) | ORAL | Status: DC
  Administered 2023-06-09 – 2023-06-10 (×2): 2.5 mg via ORAL
  Filled 2023-06-09 (×2): qty 1

## 2023-06-09 MED ORDER — ROSUVASTATIN CALCIUM 20 MG PO TABS
20.0000 mg | ORAL_TABLET | Freq: Every day | ORAL | Status: DC
  Administered 2023-06-09: 20 mg via ORAL
  Filled 2023-06-09: qty 1

## 2023-06-09 MED ORDER — AMIODARONE HCL 200 MG PO TABS
200.0000 mg | ORAL_TABLET | Freq: Every day | ORAL | Status: DC
  Administered 2023-06-10: 200 mg via ORAL
  Filled 2023-06-09 (×2): qty 1

## 2023-06-09 MED ORDER — PRIMIDONE 50 MG PO TABS
50.0000 mg | ORAL_TABLET | Freq: Every day | ORAL | Status: DC
  Administered 2023-06-09: 50 mg via ORAL
  Filled 2023-06-09 (×2): qty 1

## 2023-06-09 NOTE — ED Notes (Signed)
Dr Wallace Cullens notified of downward trend of blood pressure. Will hold amio and cardizem.

## 2023-06-09 NOTE — ED Notes (Signed)
PT at bedside to work with patient.

## 2023-06-09 NOTE — TOC Initial Note (Signed)
Transition of Care [facility]) - Initial/Assessment Note    Patient Details  Name: Kurt Atkinson MRN: [id number] Date of Birth: Jan 17, 1932  [facility]) CM/SW Contact:    Susa Simmonds, LCSWA Phone Number: 06/09/2023, 8:41 AM  Clinical Narrative:  CSW spoke with patient and his daughter [name] at bedside. Patient and daughter are interested in skilled nursing rehab. Patient denies being in a SNF facility in the past. PT consult is currently pending. CSW will start SNF bed search.                  Expected Discharge Plan: Skilled Nursing Facility Barriers to Discharge: Continued Medical Work up   Patient Goals and CMS Choice Patient states their goals for this hospitalization and ongoing recovery are:: SNF          Expected Discharge Plan and Services                                              Prior Living Arrangements/Services   Lives with:: Self Patient language and need for interpreter reviewed:: Yes Do you feel safe going back to the place where you live?: Yes      Need for Family Participation in Patient Care: Yes (Comment) Care giver support system in place?: Yes (comment)   Criminal Activity/Legal Involvement Pertinent to Current Situation/Hospitalization: No - Comment as needed  Activities of Daily Living   ADL Screening (condition at time of admission) Independently performs ADLs?: No Does the patient have a NEW difficulty with bathing/dressing/toileting/self-feeding that is expected to last >3 days?: Yes (Initiates electronic notice to provider for possible OT consult) (recent fall unable to bath and dress since Thursday) Does the patient have a NEW difficulty with getting in/out of bed, walking, or climbing stairs that is expected to last >3 days?: Yes (Initiates electronic notice to provider for possible PT consult) (recent fall unable to stand since Thursday) Does the patient have a NEW difficulty with communication that is expected  to last >3 days?: No Is the patient deaf or have difficulty hearing?: No Does the patient have difficulty seeing, even when wearing glasses/contacts?: No Does the patient have difficulty concentrating, remembering, or making decisions?: No  Permission Sought/Granted Permission sought to share information with : Facility Industrial/product designer granted to share information with : Yes, Verbal Permission Granted  Share Information with NAME: Kurt Atkinson     Permission granted to share info w Relationship: Daughter  Permission granted to share info w Contact Information: [phone]  Emotional Assessment Appearance:: Appears stated age Attitude/Demeanor/Rapport: Engaged Affect (typically observed): Accepting Orientation: : Oriented to Self, Oriented to Place, Oriented to  Time, Oriented to Situation Alcohol / Substance Use: Not Applicable Psych Involvement: No (comment)  Admission diagnosis:  fall on blood thiners Patient Active Problem List   Diagnosis Date Noted   Paroxysmal atrial fibrillation with RVR (HCC) 05/18/2023   PFO (patent foramen ovale) 05/18/2023   Emphysema lung (HCC) 05/17/2023   Lung edema, acute (HCC) 05/17/2023   Persistent atrial fibrillation (HCC) 10/13/2017   Coronary artery disease involving coronary bypass graft of native heart with angina pectoris (HCC) 01/05/2014   Atrial fibrillation (HCC) 01/05/2014   Chronic anticoagulation 01/05/2014   Essential hypertension 01/05/2014   Hyperlipidemia 01/05/2014   History of amiodarone therapy 01/05/2014   PCP:  Ander Slade [facility]:   [facility] - [place], [place] - [address] [place] [address] Phone: [phone] Fax: [phone]  [facility] - [place], [place] - [address] [place] [address] Phone: [phone] Fax: [phone]  [facility] - [place], [place] - [address] [place] [address] Phone: [phone] Fax: [phone]  [id number] - [address] [place], [place] - [address] [place] [address] Phone: [phone] Fax: [phone]     Social Determinants of Health (SDOH) Social History: SDOH Screenings   Food Insecurity: No Food Insecurity (06/08/2023)  Housing: Low Risk  (06/08/2023)  Transportation Needs: No Transportation Needs (06/08/2023)  Utilities: Not At Risk (06/08/2023)  Financial Resource Strain: Low Risk  (05/28/2023)   Received from [facility]  Physical Activity: Insufficiently Active (05/28/2023)   Received from [facility]  Tobacco Use: Medium Risk (06/08/2023)   SDOH Interventions:     Readmission Risk Interventions     No data to display

## 2023-06-09 NOTE — ED Notes (Signed)
Pt awake and alert, family at bedside. PT to come work with patient.

## 2023-06-09 NOTE — Evaluation (Signed)
Occupational Therapy Evaluation Patient Details Name: Kurt Atkinson MRN: [id number] DOB: 08-Mar-1932 Today's Date: 06/09/2023   History of Present Illness Pt is an 89 y.o. male presenting 10/28 with fall in 10/24 (hitting right knee and left ribs--x-rays unremarkable) presents with weakness and right leg pain. CT/MRI of brain negative for acute event and CT right him negative as well) PHMx: persistent A-fib, remote cardioversion, prior ablation in 2019 with recurrence, CAD s/p CABG 2001, HTN, HLD, CKD stage 4, hypothyroidism, emphysema.   Clinical Impression   This 89 yo male admitted with above presents to acute OT with PLOF of being able to manage on his home (home alone) at [facility] level. He currently is setup/Max A for basic ADLs and will continue to benefit from acute OT with follow up from continued inpatient follow up therapy, <3 hours/day.        If plan is discharge home, recommend the following: A lot of help with walking and/or transfers;A lot of help with bathing/dressing/bathroom;Assist for transportation;Help with stairs or ramp for entrance    Functional Status Assessment  Patient has had a recent decline in their functional status and demonstrates the ability to make significant improvements in function in a reasonable and predictable amount of time.  Equipment Recommendations  Other (comment) (TBD next venue)       Precautions / Restrictions Precautions Precautions: Fall Restrictions Weight Bearing Restrictions: No      Mobility Bed Mobility Overal bed mobility: Needs Assistance Bed Mobility: Supine to Sit, Sit to Supine     Supine to sit: Mod assist Sit to supine: Mod assist   General bed mobility comments: in stretcher in ED needing A to lift trunk and fully scoot hips to EOB, to supine assist for legs onto bed and cues for repositioning    Transfers Overall transfer level: Needs assistance Equipment used: None Transfers: Sit to/from Stand Sit to Stand:  Mod assist           General transfer comment: slow to rise; had a hard time getting COG over BOS with knees extended      Balance Overall balance assessment: Needs assistance Sitting-balance support: Feet supported Sitting balance-Leahy Scale: Fair     Standing balance support: Bilateral upper extremity supported Standing balance-Leahy Scale: Poor Standing balance comment: UE support for balance in standing                           ADL either performed or assessed with clinical judgement   ADL Overall ADL's : Needs assistance/impaired Eating/Feeding: Independent;Sitting   Grooming: Set up;Sitting   Upper Body Bathing: Set up;Sitting   Lower Body Bathing: Moderate assistance Lower Body Bathing Details (indicate cue type and reason): Mod a sit<>stand Upper Body Dressing : Set up;Sitting   Lower Body Dressing: Maximal assistance Lower Body Dressing Details (indicate cue type and reason): Mod a sit<>stand                     Vision Baseline Vision/History: 1 Wears glasses Patient Visual Report: No change from baseline              Pertinent Vitals/Pain Pain Assessment Pain Assessment: Faces Faces Pain Scale: Hurts even more Pain Location: R knee with movement Pain Descriptors / Indicators: Aching, Grimacing Pain Intervention(s): Limited activity within patient's tolerance, Monitored during session, Repositioned     Extremity/Trunk Assessment Upper Extremity Assessment Upper Extremity Assessment: Generalized weakness (limited AROM of shoulders due  to kyphotic posture)      Cervical / Trunk Assessment Cervical / Trunk Assessment: Kyphotic;Other exceptions Cervical / Trunk Exceptions: forward head; limited cervical extension (daughter reports can get light headed with attempts for cervical extension or prolonged time upright ambulating as if in a store)   Communication Communication Communication: No apparent difficulties   Cognition  Arousal: Alert Behavior During Therapy: WFL for tasks assessed/performed Overall Cognitive Status: Within Functional Limits for tasks assessed                                       General Comments  Drop in BP while seated EOB, but pt asymptomatic--RN made aware.             Home Living Family/patient expects to be discharged to:: Private residence Living Arrangements: Alone Available Help at Discharge: Family;Available 24 hours/day;Available PRN/intermittently;Friend(s) Type of Home: House Home Access: Stairs to enter [employer] of Steps: 1 Entrance Stairs-Rails: None Home Layout: Multi-level Alternate Level Stairs-Number of Steps: split level with stair lift to bedrooms; but does go to basement 9 steps to empty dehumidifiers down there   Bathroom Shower/Tub: Tub/shower unit         Home Equipment: Rollator (4 wheels)   Additional Comments: Had been staying at daughter's home here, but recently back home alone in [place] when he fall; Dtr is Charity fundraiser on 2H      Prior Functioning/Environment Prior Level of Function : Independent/Modified Independent             Mobility Comments: used RW and SPC, drove ADLs Comments: family cooks and does mowing of the yard Pt intermittently receives meals from family, but prepares his own meals when they do not cook        OT Problem List: Decreased strength;Impaired balance (sitting and/or standing);Decreased safety awareness;Decreased knowledge of use of DME or AE;Pain      OT Treatment/Interventions: Self-care/ADL training;DME and/or AE instruction;Patient/family education;Balance training    OT Goals(Current goals can be found in the care plan section) Acute Rehab OT Goals Patient Stated Goal: to go to SNF to get stronger so that can eventually go home or to ALF OT Goal Formulation: With patient/family Time For Goal Achievement: 06/23/23 Potential to Achieve Goals: Good  OT Frequency: Min  1X/week       AM-PAC OT "6 Clicks" Daily Activity     Outcome Measure Help from another person eating meals?: None Help from another person taking care of personal grooming?: A Little Help from another person toileting, which includes using toliet, bedpan, or urinal?: A Lot Help from another person bathing (including washing, rinsing, drying)?: A Lot Help from another person to put on and taking off regular upper body clothing?: A Little Help from another person to put on and taking off regular lower body clothing?: A Lot 6 Click Score: 16   End of Session Equipment Utilized During Treatment: Gait belt Nurse Communication: Mobility status  Activity Tolerance: Patient tolerated treatment well Patient left: in bed  OT Visit Diagnosis: Unsteadiness on feet (R26.81);Pain;Muscle weakness (generalized) (M62.81) Pain - Right/Left: Right Pain - part of body: Knee                Time: 1610-9604 OT Time Calculation (min): 47 min Charges:  OT General Charges $OT Visit: 1 Visit OT Evaluation $OT Eval Moderate Complexity: 1 Mod OT Treatments $Self Care/Home Management : 23-37 mins  Lindon Romp OT Acute Rehabilitation Services Office [phone]    Evette Georges 06/09/2023, 12:21 PM

## 2023-06-09 NOTE — Progress Notes (Signed)
Patients daughter selected [facility]. CSW spoke with [facility] in admissions who confirmed the bed offer. Patient will transition to [facility]. Insurance authorization has been started in [place]. Patients Vesta Mixer ID: [id number].

## 2023-06-09 NOTE — ED Notes (Signed)
Pt moved to hospital bed for comfort.

## 2023-06-09 NOTE — ED Notes (Signed)
MYSOLINE 50 mg requested to be sent to tube station 72 from pharmacy

## 2023-06-09 NOTE — Discharge Planning (Signed)
RNCM consulted regarding safe discharge planning (Home with Home Health vs Skilled Nursing Facility Placement).  Physical and Occupational Therapy evaluation placed; will follow up after recommendations from PT/OT.

## 2023-06-09 NOTE — ED Notes (Signed)
Shift report received, assumed care of patient at this time 

## 2023-06-09 NOTE — Evaluation (Signed)
Physical Therapy Evaluation Patient Details Name: Kurt Atkinson MRN: [id number] DOB: [birth year] Today's Date: 06/09/2023  History of Present Illness  Pt is an 89 y.o. male presenting 10/28 with fall in 10/24 (hitting right knee and left ribs--x-rays unremarkable) presents with weakness and right leg pain. CT/MRI of brain negative for acute event and CT right him negative as well) PHMx: persistent A-fib, remote cardioversion, prior ablation in 2019 with recurrence, CAD s/p CABG 2001, HTN, HLD, CKD stage 4, hypothyroidism, emphysema.  Clinical Impression  Patient presents with decreased mobility due to recent fall with L rib pain and R knee pain and increased difficulty with bed mobility and ambulation.  He was living alone just prior to his fall though had been staying here with daughter recently.  States normally using rollator for ambulation though having a lot of difficulty since his fall.  Today needing mod A for bed mobility and min A for short distance ambulation limited by pain and noted drop in BP in standing.  Patient will benefit from skilled PT in the acute setting (if admitted) and follow up inpatient rehab (<3 hours/day) prior to d/c home.    Orthostatic VS for the past 24 hrs (Last 3 readings):  BP- Sitting BP- Standing at 0 minutes BP- Standing at 3 minutes  06/09/23 1100 112/47 (!) 88/36 98/45 (after short walk)         If plan is discharge home, recommend the following: A lot of help with walking and/or transfers;A lot of help with bathing/dressing/bathroom;Assistance with cooking/housework;Assist for transportation;Help with stairs or ramp for entrance   Can travel by private vehicle   No    Equipment Recommendations None recommended by PT  Recommendations for Other Services       Functional Status Assessment Patient has had a recent decline in their functional status and demonstrates the ability to make significant improvements in function in a reasonable and  predictable amount of time.     Precautions / Restrictions Precautions Precautions: Fall      Mobility  Bed Mobility Overal bed mobility: Needs Assistance Bed Mobility: Supine to Sit, Sit to Supine     Supine to sit: Mod assist, Used rails Sit to supine: Mod assist   General bed mobility comments: in stretcher in ED needing A to lift trunk and fully scoot hips to EOB, to supine assist for legs onto bed and cues for repositioning    Transfers Overall transfer level: Needs assistance Equipment used: Rolling walker (2 wheels) Transfers: Sit to/from Stand Sit to Stand: Min assist           General transfer comment: slow to rise and cues for reaching up to walker one hand at a time and to bring COG over BOS with knees extended; to sit on EOB cues for reaching back and to sit slowly    Ambulation/Gait Ambulation/Gait assistance: Min assist Gait Distance (Feet): 20 Feet Assistive device: Rolling walker (2 wheels) Gait Pattern/deviations: Step-to pattern, Decreased stride length, Trunk flexed, Shuffle       General Gait Details: cues throughout for head up and forward gaze and assist for balance, for turning walker and to keep briefs from falling (daughter followed with chair due to low BP)  Stairs            Wheelchair Mobility     Tilt Bed    Modified Rankin (Stroke Patients Only)       Balance Overall balance assessment: Needs assistance Sitting-balance support: Feet supported Sitting balance-Leahy  Scale: Fair     Standing balance support: Bilateral upper extremity supported Standing balance-Leahy Scale: Poor Standing balance comment: UE support for balance in standing                             Pertinent Vitals/Pain Pain Assessment Pain Assessment: Faces Faces Pain Scale: Hurts little more Pain Location: R knee with movement Pain Descriptors / Indicators: Aching, Grimacing Pain Intervention(s): Repositioned, Monitored during  session, Other (comment) (wrapped with ace wrap)    Home Living Family/patient expects to be discharged to:: Private residence Living Arrangements: Alone Available Help at Discharge: Family;Available 24 hours/day;Available PRN/intermittently;Friend(s) Type of Home: House Home Access: Stairs to enter   [employer] of Steps: 1 Alternate Level Stairs-Number of Steps: split level with stair lift to bedrooms; but does go to basement 9 steps to empty dehumidifiers down there Home Layout: Multi-level Home Equipment: Rollator (4 wheels) Additional Comments: Had been staying at daughter's home here, but recently back home alone in [place] when he fall    Prior Function                       Extremity/Trunk Assessment   Upper Extremity Assessment Upper Extremity Assessment: Defer to OT evaluation    Lower Extremity Assessment Lower Extremity Assessment: RLE deficits/detail;LLE deficits/detail RLE Deficits / Details: AAROM knee flexion about to 70 with pain, knee extension strength 3+/5, hip flexion 3-/5; noted edema proximal to joint and around joint with tenderness globally and increased temp compared to L RLE Sensation: decreased light touch LLE Deficits / Details: AROM WFL, strength 4/5 throughout LLE Sensation: decreased light touch    Cervical / Trunk Assessment Cervical / Trunk Assessment: Kyphotic;Other exceptions Cervical / Trunk Exceptions: forward head; limited cervical extension (daughter reports can get light headed with attempts for cervical extension or prolonged time upright ambulating as if in a store)  Communication   Communication Communication: No apparent difficulties  Cognition Arousal: Alert Behavior During Therapy: WFL for tasks assessed/performed Overall Cognitive Status: Within Functional Limits for tasks assessed                                 General Comments: not formally tested        General Comments General  comments (skin integrity, edema, etc.): Noted BP drop in standing, though pt reported asymptomatic so ambulated, though fatigued and increasing flexed posture without cues or support; daughter is RN on 2H and son also present    Exercises     Assessment/Plan    PT Assessment Patient needs continued PT services  PT Problem List Decreased strength;Decreased activity tolerance;Pain;Decreased balance;Decreased mobility;Cardiopulmonary status limiting activity;Decreased safety awareness       PT Treatment Interventions DME instruction;Gait training;Stair training;Patient/family education;Functional mobility training;Therapeutic activities;Therapeutic exercise;Balance training    PT Goals (Current goals can be found in the Care Plan section)  Acute Rehab PT Goals Patient Stated Goal: to get to rehab PT Goal Formulation: With patient/family Time For Goal Achievement: 06/23/23 Potential to Achieve Goals: Fair    Frequency Min 1X/week     Co-evaluation               AM-PAC PT "6 Clicks" Mobility  Outcome Measure Help needed turning from your back to your side while in a flat bed without using bedrails?: A Lot Help needed moving from lying on your  back to sitting on the side of a flat bed without using bedrails?: A Lot Help needed moving to and from a bed to a chair (including a wheelchair)?: A Lot Help needed standing up from a chair using your arms (e.g., wheelchair or bedside chair)?: A Lot Help needed to walk in hospital room?: A Little Help needed climbing 3-5 steps with a railing? : Total 6 Click Score: 12    End of Session Equipment Utilized During Treatment: Gait belt (ace wrap for R knee) Activity Tolerance: Patient limited by fatigue Patient left: in bed;with family/visitor present;with call bell/phone within reach   PT Visit Diagnosis: Other abnormalities of gait and mobility (R26.89);History of falling (Z91.81);Muscle weakness (generalized) (M62.81);Pain Pain -  Right/Left: Right Pain - part of body: Knee    Time: 1012-1044 PT Time Calculation (min) (ACUTE ONLY): 32 min   Charges:   PT Evaluation $PT Eval Moderate Complexity: 1 Mod PT Treatments $Gait Training: 8-22 mins PT General Charges $$ ACUTE PT VISIT: 1 Visit         Sheran Lawless, PT Acute Rehabilitation Services [phone] 06/09/2023   Kurt Atkinson 06/09/2023, 11:16 AM

## 2023-06-09 NOTE — NC FL2 (Signed)
[place] MEDICAID FL2 LEVEL OF CARE FORM     IDENTIFICATION  Patient Name: Kurt Atkinson Birthdate: [birth year] Sex: male Admission Date (Current Location): 06/08/2023  [facility] and [place] Number:  Producer, television/film/video and Address:  [facility] [place], [address] [place], [place] [address]      Provider Number: [id number]  Attending Physician Name and Address:  System, Provider Not In  Relative Name and Phone Number:  [name] (Daughter) [phone]    Current Level of Care: Hospital Recommended Level of Care: Skilled Nursing Facility Prior Approval Number:    Date Approved/Denied:   PASRR Number: [id number]  Discharge Plan: SNF    Current Diagnoses: Patient Active Problem List   Diagnosis Date Noted   Paroxysmal atrial fibrillation with RVR (HCC) 05/18/2023   PFO (patent foramen ovale) 05/18/2023   Emphysema lung (HCC) 05/17/2023   Lung edema, acute (HCC) 05/17/2023   Persistent atrial fibrillation (HCC) 10/13/2017   Coronary artery disease involving coronary bypass graft of native heart with angina pectoris (HCC) 01/05/2014   Atrial fibrillation (HCC) 01/05/2014   Chronic anticoagulation 01/05/2014   Essential hypertension 01/05/2014   Hyperlipidemia 01/05/2014   History of amiodarone therapy 01/05/2014    Orientation RESPIRATION BLADDER Height & Weight     Self, Time, Situation, Place  Normal Continent Weight: 143 lb (64.9 kg) Height:  6\' 2"  (188 cm)  BEHAVIORAL SYMPTOMS/MOOD NEUROLOGICAL BOWEL NUTRITION STATUS      Continent Diet (Regular)  AMBULATORY STATUS COMMUNICATION OF NEEDS Skin   Extensive Assist Verbally Normal                       Personal Care Assistance Level of Assistance  Bathing, Feeding, Dressing Bathing Assistance: Maximum assistance Feeding assistance: Independent Dressing Assistance: Maximum assistance     Functional Limitations Info  Sight, Hearing, Speech Sight Info: Impaired (Wears  glasses) Hearing Info: Adequate Speech Info: Adequate    SPECIAL CARE FACTORS FREQUENCY  PT (By licensed PT), OT (By licensed OT)     PT Frequency: 5x week OT Frequency: 5x week            Contractures Contractures Info: Not present    Additional Factors Info  Code Status, Allergies Code Status Info: Full Allergies Info: Penicillins Shrimp (Shellfish Allergy)           Current Medications (06/09/2023):  This is the current hospital active medication list Current Facility-Administered Medications  Medication Dose Route Frequency Provider Last Rate Last Admin   amiodarone (PACERONE) tablet 200 mg  200 mg Oral Daily Tanda Rockers A, DO       apixaban Everlene Balls) tablet 2.5 mg  2.5 mg Oral BID Tanda Rockers A, DO       diltiazem (CARDIZEM SR) 12 hr capsule 120 mg  120 mg Oral Daily Tanda Rockers A, DO       influenza vaccine adjuvanted (FLUAD) injection 0.5 mL  0.5 mL Intramuscular Tomorrow-1000 Melene Plan, DO       levothyroxine (SYNTHROID) tablet 100 mcg  100 mcg Oral Daily Tanda Rockers A, DO       primidone (MYSOLINE) tablet 50 mg  50 mg Oral QHS Tanda Rockers A, DO       rosuvastatin (CRESTOR) tablet 20 mg  20 mg Oral QHS Tanda Rockers A, DO       tamsulosin (FLOMAX) capsule 0.4 mg  0.4 mg Oral Daily Tanda Rockers A, DO       Current  Outpatient Medications  Medication Sig Dispense Refill   acetaminophen (TYLENOL) 500 MG tablet Take 500 mg by mouth once as needed for moderate pain.     amiodarone (PACERONE) 200 MG tablet Take 1 tablet (200 mg total) by mouth 2 (two) times daily for 30 days, THEN 1 tablet (200 mg total) daily. 120 tablet 1   apixaban (ELIQUIS) 2.5 MG TABS tablet Take 1 tablet (2.5 mg total) by mouth 2 (two) times daily. 180 tablet 3   diltiazem (CARDIZEM SR) 120 MG 12 hr capsule TAKE 1 CAPSULE BY MOUTH DAILY 100 capsule 3   furosemide (LASIX) 20 MG tablet Take 1 tablet (20 mg total) by mouth 3 (three) times a week. 30 tablet 0   levothyroxine (SYNTHROID) 100 MCG  tablet Take 100 mcg by mouth daily.     primidone (MYSOLINE) 50 MG tablet Take 50 mg by mouth at bedtime.     rosuvastatin (CRESTOR) 20 MG tablet Take 20 mg by mouth at bedtime.     tamsulosin (FLOMAX) 0.4 MG CAPS capsule Take 0.4 mg by mouth daily.      Vitamin D, Ergocalciferol, (DRISDOL) 1.25 MG (50000 UNIT) CAPS capsule Take 1 capsule by mouth once a week.       Discharge Medications: Please see discharge summary for a list of discharge medications.  Relevant Imaging Results:  Relevant Lab Results:   Additional Information SSN: [id number]  Susa Simmonds, LCSWA

## 2023-06-09 NOTE — ED Notes (Signed)
Social work at bedside.  

## 2023-06-09 NOTE — ED Notes (Signed)
This RN to pt room to administer daily meds. Informed by family member at bedside that she already gave pt home medication. Medication given, Eliquis, amiodarone, Flomax.  This RN educated family that hospital supplies home meds. Family voices understanding. Synthroid given because family reports this medication was not given. This RN notified EDP Dr. Juanita Craver of above.

## 2023-06-09 NOTE — ED Provider Notes (Signed)
Emergency Medicine Observation Re-evaluation Note  Kurt Atkinson is a 89 y.o. male, seen on rounds today.  Pt initially presented to the ED for complaints of Fall Currently, the patient is resting.  Physical Exam  BP (!) 126/48   Pulse (!) 59   Temp 97.6 F (36.4 C) (Oral)   Resp 16   Ht 6\' 2"  (1.88 m)   Wt 64.9 kg   SpO2 96%   BMI 18.36 kg/m  Physical Exam General: nad Lungs: no distress Psych: resting  ED Course / MDM  EKG:EKG Interpretation Date/Time:  Monday June 08 2023 09:57:33 EDT Ventricular Rate:  64 PR Interval:    QRS Duration:  118 QT Interval:  460 QTC Calculation: 475 R Axis:   -13  Text Interpretation: AV dissociation Incomplete left bundle branch block Borderline low voltage, extremity leads Confirmed by Coralee Pesa [id number]) on 06/08/2023 1:22:51 PM  I have reviewed the labs performed to date as well as medications administered while in observation.  Recent changes in the last 24 hours include social work looking for SNF.  Patient was medically cleared by overnight team  Plan  Current plan is for placement.    Sloan Leiter, DO 06/09/23 [phone]

## 2023-06-10 NOTE — Progress Notes (Signed)
PT Cancellation Note  Patient Details Name: Kurt Atkinson MRN: [id number] DOB: 07/21/1932   Cancelled Treatment:    Reason Eval/Treat Not Completed: Other (comment) Per chart, looks like pt will likely be transitioning to SNF today. Holding PT per dept policy.   Nedra Hai, PT, DPT 06/10/23 9:47 AM

## 2023-06-10 NOTE — Discharge Instructions (Signed)
Please follow-up with your primary doctor for outpatient management.  If any symptoms change or worsen acutely, please return to the nearest emergency department.

## 2023-06-10 NOTE — ED Provider Notes (Signed)
Emergency Medicine Observation Re-evaluation Note  Kurt Atkinson is a 89 y.o. male, seen on rounds today.  Pt initially presented to the ED for complaints of Fall Currently, the patient is resting in bed with family at bedside.  Physical Exam  BP (!) 162/101   Pulse 61   Temp 98.7 F (37.1 C) (Oral)   Resp 16   Ht 6\' 2"  (1.88 m)   Wt 64.9 kg   SpO2 97%   BMI 18.36 kg/m  Physical Exam General: Resting comfortably Cardiac: No tachycardia on my evaluation Lungs: Clear bilaterally on auscultation Psych: No acute agitation  ED Course / MDM  EKG:EKG Interpretation Date/Time:  Monday June 08 2023 09:57:33 EDT Ventricular Rate:  64 PR Interval:    QRS Duration:  118 QT Interval:  460 QTC Calculation: 475 R Axis:   -13  Text Interpretation: AV dissociation Incomplete left bundle branch block Borderline low voltage, extremity leads Confirmed by Coralee Pesa [id number]) on 06/08/2023 1:22:51 PM  I have reviewed the labs performed to date as well as medications administered while in observation.  Recent changes in the last 24 hours include wanted to speak with pharmacy about getting some medication history adjusted.  Plan  Current plan is for discharge to [facility] per transition of care team.  Will discharge for placement    Latosha Gaylord, Canary Brim, MD 06/10/23 1151

## 2023-06-10 NOTE — ED Notes (Signed)
Report given to Deon Pilling, Nurse for [employer]

## 2023-06-11 LAB — AMIODARONE LEVEL
Amiodarone Lvl: 1319 ng/mL (ref 1000–2500)
N-Desethyl-Amiodarone: 1104 ng/mL

## 2023-06-18 ENCOUNTER — Ambulatory Visit: Payer: Medicare Other | Admitting: Student

## 2023-06-19 ENCOUNTER — Encounter: Payer: Self-pay | Admitting: Student

## 2023-06-19 ENCOUNTER — Ambulatory Visit: Payer: Medicare Other | Attending: Student | Admitting: Student

## 2023-06-19 VITALS — BP 142/60 | HR 60 | Ht 74.0 in | Wt 151.0 lb

## 2023-06-19 DIAGNOSIS — I10 Essential (primary) hypertension: Secondary | ICD-10-CM

## 2023-06-19 DIAGNOSIS — I4819 Other persistent atrial fibrillation: Secondary | ICD-10-CM | POA: Diagnosis not present

## 2023-06-19 DIAGNOSIS — I484 Atypical atrial flutter: Secondary | ICD-10-CM | POA: Diagnosis not present

## 2023-06-19 DIAGNOSIS — I2510 Atherosclerotic heart disease of native coronary artery without angina pectoris: Secondary | ICD-10-CM

## 2023-06-19 DIAGNOSIS — N184 Chronic kidney disease, stage 4 (severe): Secondary | ICD-10-CM

## 2023-06-19 MED ORDER — FUROSEMIDE 20 MG PO TABS: ORAL_TABLET | ORAL | Status: DC

## 2023-06-19 NOTE — Patient Instructions (Signed)
Medication Instructions:  1.Stop diltiazem 2.Take lasix 20 mg only as needed up to once daily for weight gain of 3 lbs overnight or 5 lbs in a week. *If you need a refill on your cardiac medications before your next appointment, please call your pharmacy*  Lab Work: None ordered If you have labs (blood work) drawn today and your tests are completely normal, you will receive your results only by: MyChart Message (if you have MyChart) OR A paper copy in the mail If you have any lab test that is abnormal or we need to change your treatment, we will call you to review the results.  Follow-Up: At [facility], you and your health needs are our priority.  As part of our continuing mission to provide you with exceptional heart care, we have created designated Provider Care Teams.  These Care Teams include your primary Cardiologist (physician) and Advanced Practice Providers (APPs -  Physician Assistants and Nurse Practitioners) who all work together to provide you with the care you need, when you need it.  Your next appointment:   3 month(s)  Provider:   Casimiro Needle "Otilio Saber, PA-C

## 2023-06-19 NOTE — Progress Notes (Signed)
  Electrophysiology Office Note:   Date:  06/19/2023  ID:  LUCERO ELDEN, DOB 1932/06/02, MRN [id number]  Primary Cardiologist: None Electrophysiologist: Will Jorja Loa, MD      History of Present Illness:   Kurt Atkinson is a 89 y.o. male with h/o persistent atrial fibrillation, atypical atrial flutter, remote cardioversion, prior ablation in 2019 with recurrence, CAD s/p CABG 2001, mild AI/MR and mild-moderate TR in 12/2021, HTN, HLD, hypothyroidism, CKD stage IV, and mild thrombocytopenia  seen today for post hospital follow up.    Admitted 10/6 - 10/7 with SOB and AF Since discharge from hospital the patient reports doing OK from a cardiac perspective.  he denies chest pain, palpitations, dyspnea, PND, orthopnea, nausea, vomiting, dizziness, syncope, edema, weight gain, or early satiety.   Review of systems complete and found to be negative unless listed in HPI.   EP Information / Studies Reviewed:    EKG is ordered today. Personal review as below.  EKG Interpretation Date/Time:  Friday June 19 2023 11:30:40 EST Ventricular Rate:  60 PR Interval:  242 QRS Duration:  104 QT Interval:  476 QTC Calculation: 476 R Axis:   5  Text Interpretation: Sinus rhythm with 1st degree A-V block Possible Anterior infarct , age undetermined Confirmed by Maxine Glenn [phone]) on 06/19/2023 11:34:39 AM    Arrhythmia History  Hx PVI ablation & second of discrete focus (flutter) in 2019   Physical Exam:   VS:  BP (!) 142/60   Pulse 60   Ht 6\' 2"  (1.88 m)   Wt 151 lb (68.5 kg)   SpO2 97%   BMI 19.39 kg/m    Wt Readings from Last 3 Encounters:  06/19/23 151 lb (68.5 kg)  06/08/23 143 lb (64.9 kg)  05/18/23 141 lb 8.6 oz (64.2 kg)     GEN: Well nourished, well developed in no acute distress NECK: No JVD; No carotid bruits CARDIAC: Regular rate and rhythm, no murmurs, rubs, gallops RESPIRATORY:  Clear to auscultation without rales, wheezing or rhonchi  ABDOMEN: Soft,  non-tender, non-distended EXTREMITIES:  No edema; No deformity   ASSESSMENT AND PLAN:    Persistent atrial fibrillation Atypical atrial flutter History of ablation with PVI and ablation of a second discrete focus (flutter) 2019. EKG today shows NSR Continue amiodarone 200 mg daily Last several Crs have been > 1.5. With age > 38 will decrease Eliquis to 2.5 mg BID.    Chronic diastolic CHF Adjust lasix to as needed, daily.   CAD Denies s/s ischemia   HTN Stable on current regimen    CKD IV Follows with nephrology  Follow up with EP APP in 3 months  Signed, Graciella Freer, PA-C

## 2023-07-03 NOTE — Progress Notes (Signed)
Kriste Basque is out of the office but covering for her. By review of the chart, the reasons for this test were acute hypoxic respiratory failure and dyspnea.   Thanks,  [place]

## 2023-07-27 ENCOUNTER — Ambulatory Visit: Payer: Medicare Other | Admitting: Cardiology

## 2023-08-12 DEATH — deceased

## 2023-09-24 ENCOUNTER — Ambulatory Visit: Payer: Medicare Other | Admitting: Student
